# Patient Record
Sex: Female | Born: 1985 | Hispanic: No | Marital: Single | Smoking: Current every day smoker
Health system: Southern US, Community
[De-identification: ages and names within clinical notes are randomized; demographics above are authoritative.]

## PROBLEM LIST (undated history)

## (undated) ENCOUNTER — Inpatient Hospital Stay: Payer: Self-pay

## (undated) DIAGNOSIS — O139 Gestational [pregnancy-induced] hypertension without significant proteinuria, unspecified trimester: Secondary | ICD-10-CM

## (undated) DIAGNOSIS — E282 Polycystic ovarian syndrome: Secondary | ICD-10-CM

## (undated) DIAGNOSIS — F4310 Post-traumatic stress disorder, unspecified: Secondary | ICD-10-CM

## (undated) DIAGNOSIS — F429 Obsessive-compulsive disorder, unspecified: Secondary | ICD-10-CM

## (undated) DIAGNOSIS — F411 Generalized anxiety disorder: Secondary | ICD-10-CM

## (undated) DIAGNOSIS — Z9989 Dependence on other enabling machines and devices: Secondary | ICD-10-CM

## (undated) DIAGNOSIS — G4733 Obstructive sleep apnea (adult) (pediatric): Secondary | ICD-10-CM

## (undated) DIAGNOSIS — J45909 Unspecified asthma, uncomplicated: Secondary | ICD-10-CM

## (undated) HISTORY — PX: TOOTH EXTRACTION: SUR596

## (undated) HISTORY — DX: Polycystic ovarian syndrome: E28.2

## (undated) HISTORY — DX: Obsessive-compulsive disorder, unspecified: F42.9

## (undated) HISTORY — PX: NO PAST SURGERIES: SHX2092

---

## 2004-02-07 DIAGNOSIS — O1490 Unspecified pre-eclampsia, unspecified trimester: Secondary | ICD-10-CM

## 2015-05-15 ENCOUNTER — Ambulatory Visit: Payer: Self-pay | Admitting: Family Medicine

## 2015-05-30 DIAGNOSIS — Z720 Tobacco use: Secondary | ICD-10-CM | POA: Insufficient documentation

## 2015-05-30 DIAGNOSIS — J4520 Mild intermittent asthma, uncomplicated: Secondary | ICD-10-CM | POA: Insufficient documentation

## 2015-05-30 DIAGNOSIS — F411 Generalized anxiety disorder: Secondary | ICD-10-CM | POA: Insufficient documentation

## 2015-05-30 DIAGNOSIS — E669 Obesity, unspecified: Secondary | ICD-10-CM | POA: Insufficient documentation

## 2015-06-03 ENCOUNTER — Encounter: Payer: Self-pay | Admitting: Gynecology

## 2015-06-03 ENCOUNTER — Ambulatory Visit
Admission: EM | Admit: 2015-06-03 | Discharge: 2015-06-03 | Disposition: A | Discharge status: Home / Self Care | Payer: Medicaid Other | Attending: Family Medicine | Admitting: Family Medicine

## 2015-06-03 DIAGNOSIS — F17200 Nicotine dependence, unspecified, uncomplicated: Secondary | ICD-10-CM | POA: Insufficient documentation

## 2015-06-03 DIAGNOSIS — Z79899 Other long term (current) drug therapy: Secondary | ICD-10-CM | POA: Diagnosis not present

## 2015-06-03 DIAGNOSIS — J069 Acute upper respiratory infection, unspecified: Secondary | ICD-10-CM | POA: Diagnosis present

## 2015-06-03 DIAGNOSIS — J0100 Acute maxillary sinusitis, unspecified: Secondary | ICD-10-CM

## 2015-06-03 DIAGNOSIS — R059 Cough, unspecified: Secondary | ICD-10-CM

## 2015-06-03 DIAGNOSIS — R05 Cough: Secondary | ICD-10-CM

## 2015-06-03 HISTORY — DX: Unspecified asthma, uncomplicated: J45.909

## 2015-06-03 HISTORY — DX: Post-traumatic stress disorder, unspecified: F43.10

## 2015-06-03 LAB — RAPID STREP SCREEN (MED CTR MEBANE ONLY): Streptococcus, Group A Screen (Direct): NEGATIVE

## 2015-06-03 LAB — RAPID INFLUENZA A&B ANTIGENS
Influenza A (ARMC): NOT DETECTED
Influenza B (ARMC): NOT DETECTED

## 2015-06-03 MED ORDER — HYDROCOD POLST-CPM POLST ER 10-8 MG/5ML PO SUER: 5.0000 mL | Freq: Every evening | ORAL | Status: DC | PRN

## 2015-06-03 MED ORDER — AMOXICILLIN 875 MG PO TABS: 875.0000 mg | ORAL_TABLET | Freq: Two times a day (BID) | ORAL | Status: DC

## 2015-06-03 NOTE — ED Provider Notes (Signed)
CSN: [id number]     Arrival date & time 06/03/15  0902 History   First MD Initiated Contact with Patient 06/03/15 0912     Chief Complaint  Patient presents with  . Sore Throat   (Consider location/radiation/quality/duration/timing/severity/associated sxs/prior Treatment) Patient is a 29 y.o. female presenting with URI. The history is provided by the patient.  URI Presenting symptoms: congestion, cough, facial pain, fever and sore throat   Severity:  Moderate Onset quality:  Sudden Duration:  10 days Timing:  Constant Progression:  Worsening Chronicity:  New Relieved by:  Nothing Ineffective treatments:  OTC medications Associated symptoms: headaches and sinus pain   Associated symptoms: no myalgias and no wheezing     Past Medical History  Diagnosis Date  . Asthma   . PTSD (post-traumatic stress disorder)    History reviewed. No pertinent past surgical history. No family history on file. Social History  Substance Use Topics  . Smoking status: Current Every Day Smoker  . Smokeless tobacco: None  . Alcohol Use: Yes   OB History    No data available     Review of Systems  Constitutional: Positive for fever.  HENT: Positive for congestion and sore throat.   Respiratory: Positive for cough. Negative for wheezing.   Musculoskeletal: Negative for myalgias.  Neurological: Positive for headaches.    Allergies  Review of patient's allergies indicates no known allergies.  Home Medications   Prior to Admission medications   Medication Sig Start Date End Date Taking? Authorizing Provider  albuterol (PROVENTIL) (2.5 MG/3ML) 0.083% nebulizer solution Take 2.5 mg by nebulization every 6 (six) hours as needed for wheezing or shortness of breath.   Yes Historical Provider, MD  sertraline (ZOLOFT) 100 MG tablet Take 100 mg by mouth daily.   Yes Historical Provider, MD  amoxicillin (AMOXIL) 875 MG tablet Take 1 tablet (875 mg total) by mouth 2 (two) times daily. 06/03/15    Payton Mccallum, MD  chlorpheniramine-HYDROcodone (TUSSIONEX PENNKINETIC ER) 10-8 MG/5ML SUER Take 5 mLs by mouth at bedtime as needed for cough. 06/03/15   Payton Mccallum, MD   Meds Ordered and Administered this Visit  Medications - No data to display  BP 146/94 mmHg  Pulse 64  Temp(Src) 98 F (36.7 C) (Oral)  Resp 16  Ht 5' (1.524 m)  Wt 155 lb (70.308 kg)  BMI 30.27 kg/m2  SpO2 100%  LMP 04/28/2015 No data found.   Physical Exam  Constitutional: She appears well-developed and well-nourished. No distress.  HENT:  Head: Normocephalic and atraumatic.  Right Ear: Tympanic membrane, external ear and ear canal normal.  Left Ear: Tympanic membrane, external ear and ear canal normal.  Nose: Mucosal edema and rhinorrhea present. No nose lacerations, sinus tenderness, nasal deformity, septal deviation or nasal septal hematoma. No epistaxis.  No foreign bodies. Right sinus exhibits maxillary sinus tenderness and frontal sinus tenderness. Left sinus exhibits maxillary sinus tenderness and frontal sinus tenderness.  Mouth/Throat: Uvula is midline, oropharynx is clear and moist and mucous membranes are normal. No oropharyngeal exudate.  Eyes: Conjunctivae and EOM are normal. Pupils are equal, round, and reactive to light. Right eye exhibits no discharge. Left eye exhibits no discharge. No scleral icterus.  Neck: Normal range of motion. Neck supple. No thyromegaly present.  Cardiovascular: Normal rate, regular rhythm and normal heart sounds.   Pulmonary/Chest: Effort normal and breath sounds normal. No respiratory distress. She has no wheezes. She has no rales.  Lymphadenopathy:    She has no cervical adenopathy.  Neurological: She is alert.  Skin: She is not diaphoretic.  Nursing note and vitals reviewed.   ED Course  Procedures (including critical care time)  Labs Review Labs Reviewed  RAPID STREP SCREEN (NOT AT [facility])  RAPID INFLUENZA A&B ANTIGENS ([facility] ONLY)  CULTURE, GROUP A STREP  ([facility] ONLY)    Imaging Review No results found.   Visual Acuity Review  Right Eye Distance:   Left Eye Distance:   Bilateral Distance:    Right Eye Near:   Left Eye Near:    Bilateral Near:         MDM   1. Acute maxillary sinusitis, recurrence not specified   2. Cough    Discharge Medication List as of 06/03/2015  9:52 AM    START taking these medications   Details  amoxicillin (AMOXIL) 875 MG tablet Take 1 tablet (875 mg total) by mouth 2 (two) times daily., Starting 06/03/2015, Until Discontinued, Normal    chlorpheniramine-HYDROcodone (TUSSIONEX PENNKINETIC ER) 10-8 MG/5ML SUER Take 5 mLs by mouth at bedtime as needed for cough., Starting 06/03/2015, Until Discontinued, Normal      1. Lab results and diagnosis reviewed with patient/parent/guardian/family 2. rx as per orders above; reviewed possible side effects, interactions, risks and benefits  3. Recommend supportive treatment with rest, increased fluids, otc analgesics 4. Follow-up prn if symptoms worsen or don't improve    Payton Mccallum, MD 06/03/15 1151

## 2015-06-03 NOTE — ED Notes (Signed)
Patient c/o sore throat/ chest congestion / body ache / chills x week

## 2015-06-05 LAB — CULTURE, GROUP A STREP (THRC)

## 2015-06-05 NOTE — ED Notes (Signed)
Final report of strep testing negative  

## 2015-07-28 ENCOUNTER — Encounter: Payer: Self-pay | Admitting: Emergency Medicine

## 2015-07-28 ENCOUNTER — Ambulatory Visit
Admission: EM | Admit: 2015-07-28 | Discharge: 2015-07-28 | Disposition: A | Discharge status: Home / Self Care | Payer: Medicaid Other | Attending: Family Medicine | Admitting: Family Medicine

## 2015-07-28 DIAGNOSIS — J320 Chronic maxillary sinusitis: Secondary | ICD-10-CM | POA: Diagnosis not present

## 2015-07-28 HISTORY — DX: Obsessive-compulsive disorder, unspecified: F42.9

## 2015-07-28 HISTORY — DX: Obstructive sleep apnea (adult) (pediatric): G47.33

## 2015-07-28 HISTORY — DX: Dependence on other enabling machines and devices: Z99.89

## 2015-07-28 HISTORY — DX: Generalized anxiety disorder: F41.1

## 2015-07-28 MED ORDER — FLUTICASONE PROPIONATE 50 MCG/ACT NA SUSP: 2.0000 | Freq: Every day | NASAL | Status: DC

## 2015-07-28 MED ORDER — CEFUROXIME AXETIL 250 MG PO TABS
ORAL_TABLET | ORAL | Status: DC
Start: 2015-07-28 — End: 2016-04-25

## 2015-07-28 NOTE — Discharge Instructions (Signed)

## 2015-07-28 NOTE — ED Provider Notes (Signed)
CSN: [id number]     Arrival date & time 07/28/15  1021 History   First MD Initiated Contact with Patient 07/28/15 1247     Chief Complaint  Patient presents with  . Sinusitis  . Nasal Congestion   (Consider location/radiation/quality/duration/timing/severity/associated sxs/prior Treatment) HPI   29 year old female who returns to been seen here approximately 4 weeks agowith an acute maxillary sinusitis. Flu testing and  Strep Cultures were all negative is treated with amoxicillin which she states she had completed the course spite the pills being very large. Despite this she presents now with sinus pain and pressure nasal congestion coughing with a productive brown sputum and chills but no measurable fever. She was recently been diagnosed with obstructive sleep apnea but cannot use her CPAP machine due to her nasal stuffiness. States she continues to have but body aching and fatigue. Past Medical History  Diagnosis Date  . Asthma   . PTSD (post-traumatic stress disorder)    History reviewed. No pertinent past surgical history. History reviewed. No pertinent family history. Social History  Substance Use Topics  . Smoking status: Current Every Day Smoker    Types: Cigarettes  . Smokeless tobacco: None  . Alcohol Use: Yes   OB History    No data available     Review of Systems  Constitutional: Positive for chills, activity change and fatigue. Negative for fever, diaphoresis and appetite change.  HENT: Positive for congestion, postnasal drip, rhinorrhea, sinus pressure, sneezing and sore throat.   Respiratory: Positive for cough. Negative for wheezing and stridor.   All other systems reviewed and are negative.   Allergies  Sulfa antibiotics  Home Medications   Prior to Admission medications   Medication Sig Start Date End Date Taking? Authorizing Provider  albuterol (PROVENTIL) (2.5 MG/3ML) 0.083% nebulizer solution Take 2.5 mg by nebulization every 6 (six) hours as needed for  wheezing or shortness of breath.    Historical Provider, MD  amoxicillin (AMOXIL) 875 MG tablet Take 1 tablet (875 mg total) by mouth 2 (two) times daily. 06/03/15   Payton Mccallum, MD  cefUROXime (CEFTIN) 250 MG tablet Take one tablet BID with food 07/28/15   Lutricia Feil, PA-C  chlorpheniramine-HYDROcodone (TUSSIONEX PENNKINETIC ER) 10-8 MG/5ML SUER Take 5 mLs by mouth at bedtime as needed for cough. 06/03/15   Payton Mccallum, MD  fluticasone (FLONASE) 50 MCG/ACT nasal spray Place 2 sprays into both nostrils daily. 07/28/15   Lutricia Feil, PA-C  sertraline (ZOLOFT) 100 MG tablet Take 200 mg by mouth daily.     Historical Provider, MD   Meds Ordered and Administered this Visit  Medications - No data to display  BP 144/91 mmHg  Pulse 62  Temp(Src) 97.6 F (36.4 C) (Tympanic)  Resp 16  Ht 5' (1.524 m)  Wt 153 lb (69.4 kg)  BMI 29.88 kg/m2  SpO2 100%  LMP 06/28/2015 (Approximate) No data found.   Physical Exam  Constitutional: She is oriented to person, place, and time. She appears well-developed and well-nourished. No distress.  HENT:  Head: Normocephalic and atraumatic.  Right Ear: External ear normal.  Left Ear: External ear normal.  Nose: Nose normal.  Mouth/Throat: Oropharynx is clear and moist. No oropharyngeal exudate.  Continues to have tenderness to percussion over the maxillary sinuses  Eyes: Conjunctivae are normal. Pupils are equal, round, and reactive to light. Right eye exhibits no discharge. Left eye exhibits no discharge.  Neck: Normal range of motion. Neck supple.  Pulmonary/Chest: Effort normal and breath  sounds normal. No stridor. No respiratory distress. She has no wheezes. She has no rales.  Musculoskeletal: Normal range of motion. She exhibits no edema or tenderness.  Lymphadenopathy:    She has no cervical adenopathy.  Neurological: She is alert and oriented to person, place, and time.  Skin: Skin is warm and dry. No rash noted. She is not diaphoretic.   Psychiatric: Her behavior is normal. Judgment and thought content normal.  Nursing note and vitals reviewed.   ED Course  Procedures (including critical care time)  Labs Review Labs Reviewed - No data to display  Imaging Review No results found.   Visual Acuity Review  Right Eye Distance:   Left Eye Distance:   Bilateral Distance:    Right Eye Near:   Left Eye Near:    Bilateral Near:         MDM   1. Chronic maxillary sinusitis    New Prescriptions   CEFUROXIME (CEFTIN) 250 MG TABLET    Take one tablet BID with food   FLUTICASONE (FLONASE) 50 MCG/ACT NASAL SPRAY    Place 2 sprays into both nostrils daily.  Plan: 1. Test/x-ray results and diagnosis reviewed with patient 2. rx as per orders; risks, benefits, potential side effects reviewed with patient 3. Recommend supportive treatment with fluids and rest. I advised her to use a cool mist vaporizer or humidifier at nighttime. I will add Flonase to her regimen and I have told her that I believe she hurt her amoxicillin to fully treat her sinusitis symptoms which are now to Ceftin for 10 days. She continues to still not improve then I have recommended she see ear nose and throat for further evaluation and treatment. Hopefully she'll be able to start using her CPAP machine as her sinusitis clears. 4. F/u prn if symptoms worsen or don't improve     Lutricia Feil, PA-C 07/28/15 1318

## 2015-07-28 NOTE — ED Notes (Signed)
Patient c/o sinus pain and pressure, nasal congestion off and on for 4 weeks.  Patient also reports ongoing cough and chest congestion. Patient denies fevers.

## 2015-07-30 NOTE — L&D Delivery Note (Signed)
Delivery Note Primary OB: [facility] Delivery Physician: Annamarie Major, MD Gestational Age: Premature at 52 6/[redacted] weeks gestation Antepartum complications: history of preterm delivery  Intrapartum complications: Preterm Labor  A viable Female was delivered via vertex perentation.  Apgars:9 ,9  Weight:  4 lb 13 oz .   Placenta status: spontaneous and Intact.  Cord: 3+ vessels;  with the following complications: none.  Anesthesia:  none Episiotomy:  none Lacerations:  none Suture Repair: none Est. Blood Loss (mL):  200 mL  Mom to postpartum.  Baby to Couplet care / Skin to Skin. Plans BTL for contraception.  Annamarie Major, MD Dept of OB/GYN [phone]

## 2015-10-12 ENCOUNTER — Ambulatory Visit
Admission: RE | Admit: 2015-10-12 | Discharge: 2015-10-12 | Disposition: A | Discharge status: Home / Self Care | Payer: Medicaid Other | Source: Ambulatory Visit | Attending: Emergency Medicine | Admitting: Emergency Medicine

## 2015-10-12 ENCOUNTER — Ambulatory Visit
Admission: EM | Admit: 2015-10-12 | Discharge: 2015-10-12 | Disposition: A | Discharge status: Hospice | Payer: Medicaid Other | Attending: Family Medicine | Admitting: Family Medicine

## 2015-10-12 ENCOUNTER — Encounter: Payer: Self-pay | Admitting: Emergency Medicine

## 2015-10-12 DIAGNOSIS — Z3200 Encounter for pregnancy test, result unknown: Secondary | ICD-10-CM

## 2015-10-12 DIAGNOSIS — G4733 Obstructive sleep apnea (adult) (pediatric): Secondary | ICD-10-CM | POA: Diagnosis not present

## 2015-10-12 DIAGNOSIS — Z3A01 Less than 8 weeks gestation of pregnancy: Secondary | ICD-10-CM | POA: Insufficient documentation

## 2015-10-12 DIAGNOSIS — F411 Generalized anxiety disorder: Secondary | ICD-10-CM | POA: Insufficient documentation

## 2015-10-12 DIAGNOSIS — F4310 Post-traumatic stress disorder, unspecified: Secondary | ICD-10-CM | POA: Diagnosis not present

## 2015-10-12 DIAGNOSIS — Z3490 Encounter for supervision of normal pregnancy, unspecified, unspecified trimester: Secondary | ICD-10-CM

## 2015-10-12 DIAGNOSIS — Z3201 Encounter for pregnancy test, result positive: Secondary | ICD-10-CM

## 2015-10-12 DIAGNOSIS — R102 Pelvic and perineal pain: Secondary | ICD-10-CM

## 2015-10-12 DIAGNOSIS — F429 Obsessive-compulsive disorder, unspecified: Secondary | ICD-10-CM | POA: Insufficient documentation

## 2015-10-12 DIAGNOSIS — J45909 Unspecified asthma, uncomplicated: Secondary | ICD-10-CM | POA: Diagnosis not present

## 2015-10-12 DIAGNOSIS — O009 Unspecified ectopic pregnancy without intrauterine pregnancy: Secondary | ICD-10-CM | POA: Diagnosis present

## 2015-10-12 DIAGNOSIS — F17210 Nicotine dependence, cigarettes, uncomplicated: Secondary | ICD-10-CM | POA: Insufficient documentation

## 2015-10-12 DIAGNOSIS — R109 Unspecified abdominal pain: Secondary | ICD-10-CM | POA: Diagnosis present

## 2015-10-12 LAB — COMPREHENSIVE METABOLIC PANEL
ALT: 10 U/L — ABNORMAL LOW (ref 14–54)
AST: 18 U/L (ref 15–41)
Albumin: 4.3 g/dL (ref 3.5–5.0)
Alkaline Phosphatase: 35 U/L — ABNORMAL LOW (ref 38–126)
Anion gap: 3 — ABNORMAL LOW (ref 5–15)
BUN: 9 mg/dL (ref 6–20)
CO2: 24 mmol/L (ref 22–32)
Calcium: 8.7 mg/dL — ABNORMAL LOW (ref 8.9–10.3)
Chloride: 106 mmol/L (ref 101–111)
Creatinine, Ser: 0.58 mg/dL (ref 0.44–1.00)
GFR calc Af Amer: >60 mL/min (ref >60)
GFR calc non Af Amer: >60 mL/min (ref >60)
Glucose, Bld: 98 mg/dL (ref 65–99)
Potassium: 3.7 mmol/L (ref 3.5–5.1)
Sodium: 133 mmol/L — ABNORMAL LOW (ref 135–145)
Total Bilirubin: 0.5 mg/dL (ref 0.3–1.2)
Total Protein: 7.7 g/dL (ref 6.5–8.1)

## 2015-10-12 LAB — CBC WITH DIFFERENTIAL/PLATELET
Basophils Absolute: 0.1 10*3/uL (ref 0–0.1)
Basophils Relative: 1 %
Eosinophils Absolute: 0.3 10*3/uL (ref 0–0.7)
Eosinophils Relative: 3 %
HCT: 37.3 % (ref 35.0–47.0)
Hemoglobin: 12.6 g/dL (ref 12.0–16.0)
Lymphocytes Relative: 30 %
Lymphs Abs: 2.4 10*3/uL (ref 1.0–3.6)
MCH: 32.8 pg (ref 26.0–34.0)
MCHC: 33.9 g/dL (ref 32.0–36.0)
MCV: 96.7 fL (ref 80.0–100.0)
Monocytes Absolute: 0.5 10*3/uL (ref 0.2–0.9)
Monocytes Relative: 6 %
Neutro Abs: 4.9 10*3/uL (ref 1.4–6.5)
Neutrophils Relative %: 60 %
Platelets: 332 10*3/uL (ref 150–440)
RBC: 3.85 MIL/uL (ref 3.80–5.20)
RDW: 12.9 % (ref 11.5–14.5)
WBC: 8.1 10*3/uL (ref 3.6–11.0)

## 2015-10-12 LAB — URINALYSIS COMPLETE WITH MICROSCOPIC (ARMC ONLY)
Bilirubin Urine: NEGATIVE
Glucose, UA: NEGATIVE mg/dL
Hgb urine dipstick: NEGATIVE
Ketones, ur: NEGATIVE mg/dL
Leukocytes, UA: NEGATIVE
Nitrite: NEGATIVE
Protein, ur: NEGATIVE mg/dL
Specific Gravity, Urine: 1.02 (ref 1.005–1.030)
pH: 8.5 — ABNORMAL HIGH (ref 5.0–8.0)

## 2015-10-12 LAB — HCG, QUANTITATIVE, PREGNANCY: hCG, Beta Chain, Quant, S: 11399 m[IU]/mL — ABNORMAL HIGH (ref <5)

## 2015-10-12 NOTE — ED Provider Notes (Signed)
CSN: [id number]     Arrival date & time 10/12/15  1132 History   First MD Initiated Contact with Patient 10/12/15 1202     Chief Complaint  Patient presents with  . Abdominal Pain   (Consider location/radiation/quality/duration/timing/severity/associated sxs/prior Treatment) HPI  This a 30 year old female who presents to sudden onset last night of cramping abdominal pain which is in the suprapubic region. Face that on Monday or Tuesday she had a positive home pregnancy test. She has been using birth control pills but has had multiple courses of antibiotics which may have decreased its effectiveness. She denies any vaginal bleeding or  discharge. She states that the pain in the suprapubic area also be perceived in the vagina as well.      Past Medical History  Diagnosis Date  . Asthma   . PTSD (post-traumatic stress disorder)   . OSA on CPAP   . Obsessive compulsive disorder   . GAD (generalized anxiety disorder)    History reviewed. No pertinent past surgical history. History reviewed. No pertinent family history. Social History  Substance Use Topics  . Smoking status: Current Every Day Smoker    Types: Cigarettes  . Smokeless tobacco: None  . Alcohol Use: No   OB History    Gravida Para Term Preterm AB TAB SAB Ectopic Multiple Living   1              Review of Systems  Constitutional: Negative for fever, chills, activity change and fatigue.  Genitourinary: Positive for vaginal pain and pelvic pain. Negative for urgency, decreased urine volume, vaginal bleeding and vaginal discharge.  All other systems reviewed and are negative.   Allergies  Sulfa antibiotics  Home Medications   Prior to Admission medications   Medication Sig Start Date End Date Taking? Authorizing Provider  sertraline (ZOLOFT) 100 MG tablet Take 200 mg by mouth daily.    Yes Historical Provider, MD  albuterol (PROVENTIL) (2.5 MG/3ML) 0.083% nebulizer solution Take 2.5 mg by nebulization every 6  (six) hours as needed for wheezing or shortness of breath.    Historical Provider, MD  amoxicillin (AMOXIL) 875 MG tablet Take 1 tablet (875 mg total) by mouth 2 (two) times daily. 06/03/15   Payton Mccallum, MD  cefUROXime (CEFTIN) 250 MG tablet Take one tablet BID with food 07/28/15   Lutricia Feil, PA-C  chlorpheniramine-HYDROcodone (TUSSIONEX PENNKINETIC ER) 10-8 MG/5ML SUER Take 5 mLs by mouth at bedtime as needed for cough. 06/03/15   Payton Mccallum, MD  fluticasone (FLONASE) 50 MCG/ACT nasal spray Place 2 sprays into both nostrils daily. 07/28/15   Lutricia Feil, PA-C   Meds Ordered and Administered this Visit  Medications - No data to display  BP 111/78 mmHg  Pulse 67  Temp(Src) 97.8 F (36.6 C) (Oral)  Resp 18  Ht 5' (1.524 m)  Wt 161 lb (73.029 kg)  BMI 31.44 kg/m2  SpO2 100%  LMP 09/02/2015 (Approximate) No data found.   Physical Exam  Constitutional: She is oriented to person, place, and time. She appears well-developed and well-nourished.  HENT:  Head: Normocephalic and atraumatic.  Eyes: Conjunctivae are normal. Pupils are equal, round, and reactive to light.  Neck: Normal range of motion. Neck supple.  Pulmonary/Chest: Effort normal and breath sounds normal. No respiratory distress. She has no wheezes. She has no rales.  Abdominal: Soft. Bowel sounds are normal. She exhibits no distension and no mass. There is tenderness. There is no rebound and no guarding.  Examination shows  the patient to have  mild suprapubic pain in the midline  Musculoskeletal: Normal range of motion.  Neurological: She is alert and oriented to person, place, and time.  Skin: Skin is warm and dry.  Psychiatric: She has a normal mood and affect. Her behavior is normal. Judgment and thought content normal.  Nursing note and vitals reviewed.   ED Course  Procedures (including critical care time)  Labs Review Labs Reviewed  HCG, QUANTITATIVE, PREGNANCY - Abnormal; Notable for the  following:    hCG, Beta Chain, Quant, S 11399 (*)    All other components within normal limits  URINALYSIS COMPLETEWITH MICROSCOPIC ([facility] ONLY) - Abnormal; Notable for the following:    pH 8.5 (*)    Bacteria, UA RARE (*)    Squamous Epithelial / LPF 0-5 (*)    All other components within normal limits  COMPREHENSIVE METABOLIC PANEL - Abnormal; Notable for the following:    Sodium 133 (*)    Calcium 8.7 (*)    ALT 10 (*)    Alkaline Phosphatase 35 (*)    Anion gap 3 (*)    All other components within normal limits  CBC WITH DIFFERENTIAL/PLATELET    Imaging Review No results found.   Visual Acuity Review  Right Eye Distance:   Left Eye Distance:   Bilateral Distance:    Right Eye Near:   Left Eye Near:    Bilateral Near:         MDM   1. Pregnancy   2. Ectopic fetus    Plan: 1. Test/x-ray results and diagnosis reviewed with patient 2. rx as per orders; risks, benefits, potential side effects reviewed with patient 3. Recommend supportive treatment with  OB next week. He has per pain and her being but [redacted] weeks pregnant to rule out a possibility of the neck top will send her for an ultrasound today. If this is negative then she will follow-up with an OB next week. We've given her the brochure for [facility]. 4. F/u prn if symptoms worsen or don't improve     Lutricia Feil, PA-C 10/12/15 [address] Phillis Knack, [place] 10/12/15 1351

## 2015-10-12 NOTE — ED Notes (Signed)
Pt reports cramping abdominal pain started last night. Reports just had positive home pregnancy test on Tuesday. Denies vaginal bleeding.

## 2015-10-12 NOTE — Discharge Instructions (Signed)
Eating Plan for Pregnant Women °While you are pregnant, your body will require additional nutrition to help support your growing baby. It is recommended that you consume: °· 150 additional calories each day during your first trimester. °· 300 additional calories each day during your second trimester. °· 300 additional calories each day during your third trimester. °Eating a healthy, well-balanced diet is very important for your health and for your baby's health. You also have a higher need for some vitamins and minerals, such as folic acid, calcium, iron, and vitamin D. °WHAT DO I NEED TO KNOW ABOUT EATING DURING PREGNANCY? °· Do not try to lose weight or go on a diet during pregnancy. °· Choose healthy, nutritious foods. Choose ½ of a sandwich with a glass of milk instead of a candy bar or a high-calorie sugar-sweetened beverage. °· Limit your overall intake of foods that have "empty calories." These are foods that have little nutritional value, such as sweets, desserts, candies, sugar-sweetened beverages, and fried foods. °· Eat a variety of foods, especially fruits and vegetables. °· Take a prenatal vitamin to help meet the additional needs during pregnancy, specifically for folic acid, iron, calcium, and vitamin D. °· Remember to stay active. Ask your health care provider for exercise recommendations that are specific to you. °· Practice good food safety and cleanliness, such as washing your hands before you eat and after you prepare raw meat. This helps to prevent foodborne illnesses, such as listeriosis, that can be very dangerous for your baby. Ask your health care provider for more information about listeriosis. °WHAT DOES 150 EXTRA CALORIES LOOK LIKE? °Healthy options for an additional 150 calories each day could be any of the following: °· Plain low-fat yogurt (6-8 oz) with ½ cup of berries. °· 1 apple with 2 teaspoons of peanut butter. °· Cut-up vegetables with ¼ cup of hummus. °· Low-fat chocolate milk  (8 oz or 1 cup). °· 1 string cheese with 1 medium orange. °· ½ of a peanut butter and jelly sandwich on whole-wheat bread (1 tsp of peanut butter). °For 300 calories, you could eat two of those healthy options each day.  °WHAT IS A HEALTHY AMOUNT OF WEIGHT TO GAIN? °The recommended amount of weight for you to gain is based on your pre-pregnancy BMI. If your pre-pregnancy BMI was: °· Less than 18 (underweight), you should gain 28-40 lb. °· 18-24.9 (normal), you should gain 25-35 lb. °· 25-29.9 (overweight), you should gain 15-25 lb. °· Greater than 30 (obese), you should gain 11-20 lb. °WHAT IF I AM HAVING TWINS OR MULTIPLES? °Generally, pregnant women who will be having twins or multiples may need to increase their daily calories by 300-600 calories each day. The recommended range for total weight gain is 25-54 lb, depending on your pre-pregnancy BMI. Talk with your health care provider for specific guidance about additional nutritional needs, weight gain, and exercise during your pregnancy. °WHAT FOODS CAN I EAT? °Grains °Any grains. Try to choose whole grains, such as whole-wheat bread, oatmeal, or brown rice. °Vegetables °Any vegetables. Try to eat a variety of colors and types of vegetables to get a full range of vitamins and minerals. Remember to wash your vegetables well before eating. °Fruits °Any fruits. Try to eat a variety of colors and types of fruit to get a full range of vitamins and minerals. Remember to wash your fruits well before eating. °Meats and Other Protein Sources °Lean meats, including chicken, turkey, fish, and lean cuts of beef, veal, or pork.   Make sure that all meats are cooked to "well done." Tofu. Tempeh. Beans. Eggs. Peanut butter and other nut butters. Seafood, such as shrimp, crab, and lobster. If you choose fish, select types that are higher in omega-3 fatty acids, including salmon, herring, mussels, trout, sardines, and pollock. Make sure that all meats are cooked to food-safe  temperatures. Dairy Pasteurized milk and milk alternatives. Pasteurized yogurt and pasteurized cheese. Cottage cheese. Sour cream. Beverages Water. Juices that contain 100% fruit juice or vegetable juice. Caffeine-free teas and decaffeinated coffee. Drinks that contain caffeine are okay to drink, but it is better to avoid caffeine. Keep your total caffeine intake to less than 200 mg each day (12 oz of coffee, tea, or soda) or as directed by your health care provider. Condiments Any pasteurized condiments. Sweets and Desserts Any sweets and desserts. Fats and Oils Any fats and oils. The items listed above may not be a complete list of recommended foods or beverages. Contact your dietitian for more options. WHAT FOODS ARE NOT RECOMMENDED? Vegetables Unpasteurized (raw) vegetable juices. Fruits Unpasteurized (raw) fruit juices. Meats and Other Protein Sources Cured meats that have nitrates, such as bacon, salami, and hotdogs. Luncheon meats, bologna, or other deli meats (unless they are reheated until they are steaming hot). Refrigerated pate, meat spreads from a meat counter, smoked seafood that is found in the refrigerated section of a store. Raw fish, such as sushi or sashimi. High mercury content fish, such as tilefish, shark, swordfish, and king mackerel. Raw meats, such as tuna or beef tartare. Undercooked meats and poultry. Make sure that all meats are cooked to food-safe temperatures. Dairy Unpasteurized (raw) milk and any foods that have raw milk in them. Soft cheeses, such as feta, queso blanco, queso fresco, Brie, Camembert cheeses, blue-veined cheeses, and Panela cheese (unless it is made with pasteurized milk, which must be stated on the label). Beverages Alcohol. Sugar-sweetened beverages, such as sodas, teas, or energy drinks. Condiments Homemade fermented foods and drinks, such as pickles, sauerkraut, or kombucha drinks. (Store-bought pasteurized versions of these are  okay.) Other Salads that are made in the store, such as ham salad, chicken salad, egg salad, tuna salad, and seafood salad. The items listed above may not be a complete list of foods and beverages to avoid. Contact your dietitian for more information.   This information is not intended to replace advice given to you by your health care provider. Make sure you discuss any questions you have with your health care provider.   Document Released: 04/29/2014 Document Reviewed: 04/29/2014 Elsevier Interactive Patient Education [employer]  First Trimester of Pregnancy The first trimester of pregnancy is from week 1 until the end of week 12 (months 1 through 3). A week after a sperm fertilizes an egg, the egg will implant on the wall of the uterus. This embryo will begin to develop into a baby. Genes from you and your partner are forming the baby. The female genes determine whether the baby is a boy or a girl. At 6-8 weeks, the eyes and face are formed, and the heartbeat can be seen on ultrasound. At the end of 12 weeks, all the baby's organs are formed.  Now that you are pregnant, you will want to do everything you can to have a healthy baby. Two of the most important things are to get good prenatal care and to follow your health care provider's instructions. Prenatal care is all the medical care you receive before the baby's birth. This  care will help prevent, find, and treat any problems during the pregnancy and childbirth. BODY CHANGES Your body goes through many changes during pregnancy. The changes vary from woman to woman.   You may gain or lose a couple of pounds at first.  You may feel sick to your stomach (nauseous) and throw up (vomit). If the vomiting is uncontrollable, call your health care provider.  You may tire easily.  You may develop headaches that can be relieved by medicines approved by your health care provider.  You may urinate more often. Painful urination may mean you  have a bladder infection.  You may develop heartburn as a result of your pregnancy.  You may develop constipation because certain hormones are causing the muscles that push waste through your intestines to slow down.  You may develop hemorrhoids or swollen, bulging veins (varicose veins).  Your breasts may begin to grow larger and become tender. Your nipples may stick out more, and the tissue that surrounds them (areola) may become darker.  Your gums may bleed and may be sensitive to brushing and flossing.  Dark spots or blotches (chloasma, mask of pregnancy) may develop on your face. This will likely fade after the baby is born.  Your menstrual periods will stop.  You may have a loss of appetite.  You may develop cravings for certain kinds of food.  You may have changes in your emotions from day to day, such as being excited to be pregnant or being concerned that something may go wrong with the pregnancy and baby.  You may have more vivid and strange dreams.  You may have changes in your hair. These can include thickening of your hair, rapid growth, and changes in texture. Some women also have hair loss during or after pregnancy, or hair that feels dry or thin. Your hair will most likely return to normal after your baby is born. WHAT TO EXPECT AT YOUR PRENATAL VISITS During a routine prenatal visit:  You will be weighed to make sure you and the baby are growing normally.  Your blood pressure will be taken.  Your abdomen will be measured to track your baby's growth.  The fetal heartbeat will be listened to starting around week 10 or 12 of your pregnancy.  Test results from any previous visits will be discussed. Your health care provider may ask you:  How you are feeling.  If you are feeling the baby move.  If you have had any abnormal symptoms, such as leaking fluid, bleeding, severe headaches, or abdominal cramping.  If you are using any tobacco products, including  cigarettes, chewing tobacco, and electronic cigarettes.  If you have any questions. Other tests that may be performed during your first trimester include:  Blood tests to find your blood type and to check for the presence of any previous infections. They will also be used to check for low iron levels (anemia) and Rh antibodies. Later in the pregnancy, blood tests for diabetes will be done along with other tests if problems develop.  Urine tests to check for infections, diabetes, or protein in the urine.  An ultrasound to confirm the proper growth and development of the baby.  An amniocentesis to check for possible genetic problems.  Fetal screens for spina bifida and Down syndrome.  You may need other tests to make sure you and the baby are doing well.  HIV (human immunodeficiency virus) testing. Routine prenatal testing includes screening for HIV, unless you choose not to have  this test. HOME CARE INSTRUCTIONS  Medicines  Follow your health care provider's instructions regarding medicine use. Specific medicines may be either safe or unsafe to take during pregnancy.  Take your prenatal vitamins as directed.  If you develop constipation, try taking a stool softener if your health care provider approves. Diet  Eat regular, well-balanced meals. Choose a variety of foods, such as meat or vegetable-based protein, fish, milk and low-fat dairy products, vegetables, fruits, and whole grain breads and cereals. Your health care provider will help you determine the amount of weight gain that is right for you.  Avoid raw meat and uncooked cheese. These carry germs that can cause birth defects in the baby.  Eating four or five small meals rather than three large meals a day may help relieve nausea and vomiting. If you start to feel nauseous, eating a few soda crackers can be helpful. Drinking liquids between meals instead of during meals also seems to help nausea and vomiting.  If you develop  constipation, eat more high-fiber foods, such as fresh vegetables or fruit and whole grains. Drink enough fluids to keep your urine clear or pale yellow. Activity and Exercise  Exercise only as directed by your health care provider. Exercising will help you:  Control your weight.  Stay in shape.  Be prepared for labor and delivery.  Experiencing pain or cramping in the lower abdomen or low back is a good sign that you should stop exercising. Check with your health care provider before continuing normal exercises.  Try to avoid standing for long periods of time. Move your legs often if you must stand in one place for a long time.  Avoid heavy lifting.  Wear low-heeled shoes, and practice good posture.  You may continue to have sex unless your health care provider directs you otherwise. Relief of Pain or Discomfort  Wear a good support bra for breast tenderness.   Take warm sitz baths to soothe any pain or discomfort caused by hemorrhoids. Use hemorrhoid cream if your health care provider approves.   Rest with your legs elevated if you have leg cramps or low back pain.  If you develop varicose veins in your legs, wear support hose. Elevate your feet for 15 minutes, 3-4 times a day. Limit salt in your diet. Prenatal Care  Schedule your prenatal visits by the twelfth week of pregnancy. They are usually scheduled monthly at first, then more often in the last 2 months before delivery.  Write down your questions. Take them to your prenatal visits.  Keep all your prenatal visits as directed by your health care provider. Safety  Wear your seat belt at all times when driving.  Make a list of emergency phone numbers, including numbers for family, friends, the hospital, and police and fire departments. General Tips  Ask your health care provider for a referral to a local prenatal education class. Begin classes no later than at the beginning of month 6 of your pregnancy.  Ask for  help if you have counseling or nutritional needs during pregnancy. Your health care provider can offer advice or refer you to specialists for help with various needs.  Do not use hot tubs, steam rooms, or saunas.  Do not douche or use tampons or scented sanitary pads.  Do not cross your legs for long periods of time.  Avoid cat litter boxes and soil used by cats. These carry germs that can cause birth defects in the baby and possibly loss of the fetus  by miscarriage or stillbirth.  Avoid all smoking, herbs, alcohol, and medicines not prescribed by your health care provider. Chemicals in these affect the formation and growth of the baby.  Do not use any tobacco products, including cigarettes, chewing tobacco, and electronic cigarettes. If you need help quitting, ask your health care provider. You may receive counseling support and other resources to help you quit.  Schedule a dentist appointment. At home, brush your teeth with a soft toothbrush and be gentle when you floss. SEEK MEDICAL CARE IF:   You have dizziness.  You have mild pelvic cramps, pelvic pressure, or nagging pain in the abdominal area.  You have persistent nausea, vomiting, or diarrhea.  You have a bad smelling vaginal discharge.  You have pain with urination.  You notice increased swelling in your face, hands, legs, or ankles. SEEK IMMEDIATE MEDICAL CARE IF:   You have a fever.  You are leaking fluid from your vagina.  You have spotting or bleeding from your vagina.  You have severe abdominal cramping or pain.  You have rapid weight gain or loss.  You vomit blood or material that looks like coffee grounds.  You are exposed to [place] measles and have never had them.  You are exposed to fifth disease or chickenpox.  You develop a severe headache.  You have shortness of breath.  You have any kind of trauma, such as from a fall or a car accident.   This information is not intended to replace advice  given to you by your health care provider. Make sure you discuss any questions you have with your health care provider.   Document Released: 07/09/2001 Document Revised: 08/05/2014 Document Reviewed: 05/25/2013 Elsevier Interactive Patient Education [employer]

## 2015-10-12 NOTE — ED Notes (Addendum)
Pre-approval for Ultrasound obtained from Medicaid. Case # [id number]. Approval # [id number]. CPT codes R10.9, Z32.00 and Z33.10.

## 2016-03-03 ENCOUNTER — Emergency Department
Admission: EM | Admit: 2016-03-03 | Discharge: 2016-03-03 | Disposition: A | Discharge status: Home / Self Care | Payer: Medicaid Other | Attending: Emergency Medicine | Admitting: Emergency Medicine

## 2016-03-03 ENCOUNTER — Emergency Department: Payer: Medicaid Other

## 2016-03-03 ENCOUNTER — Encounter: Payer: Self-pay | Admitting: Emergency Medicine

## 2016-03-03 DIAGNOSIS — F17210 Nicotine dependence, cigarettes, uncomplicated: Secondary | ICD-10-CM | POA: Diagnosis not present

## 2016-03-03 DIAGNOSIS — M545 Low back pain, unspecified: Secondary | ICD-10-CM

## 2016-03-03 DIAGNOSIS — Z7951 Long term (current) use of inhaled steroids: Secondary | ICD-10-CM | POA: Diagnosis not present

## 2016-03-03 DIAGNOSIS — O26892 Other specified pregnancy related conditions, second trimester: Secondary | ICD-10-CM | POA: Diagnosis not present

## 2016-03-03 DIAGNOSIS — J45909 Unspecified asthma, uncomplicated: Secondary | ICD-10-CM | POA: Insufficient documentation

## 2016-03-03 DIAGNOSIS — O99332 Smoking (tobacco) complicating pregnancy, second trimester: Secondary | ICD-10-CM | POA: Diagnosis not present

## 2016-03-03 DIAGNOSIS — Z3A26 26 weeks gestation of pregnancy: Secondary | ICD-10-CM | POA: Insufficient documentation

## 2016-03-03 DIAGNOSIS — M5441 Lumbago with sciatica, right side: Secondary | ICD-10-CM | POA: Diagnosis not present

## 2016-03-03 DIAGNOSIS — M5431 Sciatica, right side: Secondary | ICD-10-CM

## 2016-03-03 NOTE — Discharge Instructions (Signed)
You're being treated for symptoms consistent with sciatica, pinched nerve. Continue Tylenol as needed for pain. You may in addition take over-the-counter ibuprofen 400 mg 3 times a day for 2 more days.    You need to follow up with your OB/GYN doctor for likely referral for physical therapy.

## 2016-03-03 NOTE — ED Notes (Signed)
NAD noted at time of D/C. Pt denies questions or concerns. Pt ambulatory to the lobby at this time.  

## 2016-03-03 NOTE — ED Triage Notes (Signed)
Pt c/o lower back pain x 2-3 weeks. Pt states she is [redacted] weeks pregnant. Denies any known injury. Pt states pain unrelieved with Tylenol. Also c/o hip and buttocks. Pt states pain radiates down her back.

## 2016-03-03 NOTE — ED Provider Notes (Signed)
[facility] Emergency Department Provider Note ____________________________________________  Time seen:  I have reviewed the triage vital signs and the triage nursing note.  HISTORY  Chief Complaint Back Pain   Historian Patient  HPI Lauren Tapia is a 30 y.o. female who is approximately [redacted] weeks pregnant, is presenting for low back pain on the right for over a week now which is worsening and goes into the buttock and down the posterior right leg. No trauma. No known overuse. No history of previous problems with low back pain. No history of cancers. No history of osteopenia although she states that she does not drink milk. No abdominal pain. No abnormal vaginal discharge. No fluid leakage or vaginal bleeding. She's been feeling the baby move.  She had been trying Tylenol at home and spoke with her OB/GYN doctor today who recommended ibuprofen and she tried that and didn't seem to help so she came in today for evaluation.  Pain is moderate to severe, no weakness or numbness. No incontinence.  Movement makes the pain worse.    Past Medical History:  Diagnosis Date  . Asthma   . GAD (generalized anxiety disorder)   . Obsessive compulsive disorder   . OSA on CPAP   . PTSD (post-traumatic stress disorder)     Patient Active Problem List   Diagnosis Date Noted  . Ectopic fetus 10/12/2015    History reviewed. No pertinent surgical history.  Prior to Admission medications   Medication Sig Start Date End Date Taking? Authorizing Provider  albuterol (PROVENTIL) (2.5 MG/3ML) 0.083% nebulizer solution Take 2.5 mg by nebulization every 6 (six) hours as needed for wheezing or shortness of breath.    Historical Provider, MD  amoxicillin (AMOXIL) 875 MG tablet Take 1 tablet (875 mg total) by mouth 2 (two) times daily. 06/03/15   Payton Mccallum, MD  cefUROXime (CEFTIN) 250 MG tablet Take one tablet BID with food 07/28/15   Lutricia Feil, PA-C   chlorpheniramine-HYDROcodone (TUSSIONEX PENNKINETIC ER) 10-8 MG/5ML SUER Take 5 mLs by mouth at bedtime as needed for cough. 06/03/15   Payton Mccallum, MD  fluticasone (FLONASE) 50 MCG/ACT nasal spray Place 2 sprays into both nostrils daily. 07/28/15   Lutricia Feil, PA-C  sertraline (ZOLOFT) 100 MG tablet Take 200 mg by mouth daily.     Historical Provider, MD    Allergies  Allergen Reactions  . Sulfa Antibiotics Other (See Comments)    No family history on file.  Social History Social History  Substance Use Topics  . Smoking status: Current Every Day Smoker    Types: Cigarettes  . Smokeless tobacco: Not on file  . Alcohol use No    Review of Systems  Constitutional: Negative for fever. Eyes: Negative for visual changes. ENT: Negative for sore throat. Cardiovascular: Negative for chest pain. Respiratory: Negative for shortness of breath. Gastrointestinal: Negative for abdominal pain, vomiting and diarrhea. Genitourinary: Negative for dysuria. Musculoskeletal: Positive for back pain as per history of present illness. Skin: Negative for rash. Neurological: Negative for headache. 10 point Review of Systems otherwise negative ____________________________________________   PHYSICAL EXAM:  VITAL SIGNS: ED Triage Vitals  Enc Vitals Group     BP 03/03/16 2011 108/76     Pulse Rate 03/03/16 2011 84     Resp --      Temp 03/03/16 2011 98.1 F (36.7 C)     Temp Source 03/03/16 2011 Oral     SpO2 03/03/16 2102 98 %  Weight 03/03/16 2011 155 lb (70.3 kg)     Height 03/03/16 2011 5' (1.524 m)     Head Circumference --      Peak Flow --      Pain Score 03/03/16 2019 10     Pain Loc --      Pain Edu? --      Excl. in GC? --      Constitutional: Alert and oriented. Well appearing and in no distress. HEENT   Head: Normocephalic and atraumatic.      Eyes: Conjunctivae are normal. PERRL. Normal extraocular movements.      Ears:         Nose: No  congestion/rhinnorhea.   Mouth/Throat: Mucous membranes are moist.   Neck: No stridor. Cardiovascular/Chest: Normal rate, regular rhythm.  No murmurs, rubs, or gallops. Respiratory: Normal respiratory effort without tachypnea nor retractions. Breath sounds are clear and equal bilaterally. No wheezes/rales/rhonchi. Gastrointestinal: Soft. No distention, no guarding, no rebound. Nontender.  Gravid above the umbilicus.  Genitourinary/rectal:Deferred Musculoskeletal: Mild tenderness to palpation in the lumbar spine especially on the right side into the right buttock. Normal range of motion in all extremities. No joint effusions.  No lower extremity tenderness.  No edema. Neurologic:  Normal speech and language. No gross or focal neurologic deficits are appreciated. Skin:  Skin is warm, dry and intact. No rash noted. Psychiatric: Mood and affect are normal. Speech and behavior are normal. Patient exhibits appropriate insight and judgment.  ____________________________________________   EKG I, Governor Rooks, MD, the attending physician have personally viewed and interpreted all ECGs.  None ____________________________________________  LABS (pertinent positives/negatives)  Labs Reviewed - No data to display  ____________________________________________  RADIOLOGY All Xrays were viewed by me. Imaging interpreted by Radiologist.  Lumbar spine 2 view: IMPRESSION: No evidence of fracture or subluxation along the lumbar spine. __________________________________________  PROCEDURES  Procedure(s) performed: None  Critical Care performed: None  ____________________________________________   ED COURSE / ASSESSMENT AND PLAN  Pertinent labs & imaging results that were available during my care of the patient were reviewed by me and considered in my medical decision making (see chart for details).   This patient's symptoms are consistent clinically with right-sided sciatica. She  does not have any step-offs palpating her spine, and does not give a history of trauma or known osteopenia or history of cancers, however when I discussed with the patient that I would not necessarily initially jumped to imaging in a patient of this age with symptomatic sciatica, she questioned whether or not it possible to have a fracture without history of trauma. We discussed the rare chance of pathologic fracture due to cancer osteopenia, and she wanted to proceed with x-ray to rule this out. I spoke with the OB/GYN on-call Dr. Jean Rosenthal who indicated to me that he did speak with her earlier and she was stating that she was having pain into her hip and he did recommend ibuprofen 400 mg 3 times a day for just a few days, and indicated that he did not necessarily indicate to her that she needed to come to the emergency department, and was recommending that she would likely need physical therapy. He does not recommend narcotic pain management. I discussed with him imaging, and consistent with my impression, he would not have necessarily imaged, but given patient's high concern and agreement with risk versus benefit, we chose to proceed with x-ray. I also spoke with the radiologist who recommended just 2 views to limit radiation exposure to  the fetus.  The x-ray is negative for fracture or abnormality. I discussed with patient her symptoms are consistent with sciatica likely from pregnancy. I am going to recommend that she continue the anti-inflammatory ibuprofen in addition to the Tylenol for the next day or so and follow-up in the office with OB/GYN. They likely will refer her for physical therapy. At this point in time I am not going to start her on any narcotic medication. At this point time I'm not going to start her on prednisone, although I did discuss with her this might be something that the OB/GYN doctors might consider, if several days on the anti-inflammatory does not provide improvement.  Again, no  abdominal symptoms or discomfort, or pelvic discomfort.  CONSULTATIONS:   None   Patient / Family / Caregiver informed of clinical course, medical decision-making process, and agree with plan.   I discussed return precautions, follow-up instructions, and discharged instructions with patient and/or family.   ___________________________________________   FINAL CLINICAL IMPRESSION(S) / ED DIAGNOSES   Final diagnoses:  Low back pain  Sciatica of right side              Note: This dictation was prepared with Dragon dictation. Any transcriptional errors that result from this process are unintentional    Governor Rooks, MD 03/03/16 2245

## 2016-03-03 NOTE — ED Notes (Signed)
Pt ambulatory with slow steady gait; says she thinks she has sciatica; pt is pregnant and only taking tylenol at home with no relief

## 2016-03-25 ENCOUNTER — Ambulatory Visit: Payer: Medicaid Other | Attending: Obstetrics & Gynecology | Admitting: Physical Therapy

## 2016-03-25 DIAGNOSIS — M791 Myalgia, unspecified site: Secondary | ICD-10-CM

## 2016-03-25 DIAGNOSIS — M6281 Muscle weakness (generalized): Secondary | ICD-10-CM | POA: Diagnosis present

## 2016-03-25 NOTE — Patient Instructions (Addendum)
Stretngthening :  Clam Shell 45 Degrees   Lying with hips and knees bent 45, one pillow between knees and ankles. Lift knee with exhale. Be sure pelvis does not roll backward. Do not arch back. Do 10 times, each leg, 2-3 times per day.  http://ss.exer.us/75   Copyright  VHI. All rights reserved.   SEATED VERSION: with proper sitting posture. 10 reps with hands against thighs, exhale and push thighs outside hands   ______________________________  Pillow squeeze between knees 10 reps   SEATED VERSION: with proper sitting posture. Pillow folded  ______________________________   Side stepping to the right, mini squat  5 reps,   To left 5 reps  Build up to 10 reps.   ________________________________  Stretches  Figure four stretch  5 breaths  childs pose rocking (pillow under belly)    _______________________________  Daily activities :  Standing with knees slight bent, feet hip width  (avoid leaning on one leg)   Lifting:  Mini squat with feet wide, knees behind toes (make sure you can still see your toes)  Inhale  Exhale as you stand  Avoid holding your breath when Getting out of the chair:  Scoot to front half of the chair Heels under feet nose over toes  Inhale like you are smelling roses Exhale to stand   Sitting with feet under knees   Getting into shower/ car: Butt first, lift leg on exhale, small movements   Get shower bench and a toilet chair  _________________    More resources: [employer] by [place]

## 2016-03-26 NOTE — Therapy (Signed)
[place] MAIN [facility] SERVICES [address] [place], [place], [address] Phone: [phone]   Fax:  [phone]  Physical Therapy Evaluation  Patient Details  Name: Lauren Tapia MRN: [id number] Date of Birth: 1986/03/23 Referring Provider: Dr. Kenton Kingfisher   Encounter Date: 03/25/2016      PT End of Session - 03/26/16 1550    Visit Number 1   Number of Visits 1   Date for PT Re-Evaluation 03/26/16   Authorization Type Medicaid coverage limited to 1 visit   PT Start Time 1100   PT Stop Time 1210   PT Time Calculation (min) 70 min   Activity Tolerance Patient tolerated treatment well;No increased pain   Behavior During Therapy WFL for tasks assessed/performed      Past Medical History:  Diagnosis Date  . Asthma   . GAD (generalized anxiety disorder)   . Obsessive compulsive disorder   . OSA on CPAP   . PTSD (post-traumatic stress disorder)     No past surgical history on file.  There were no vitals filed for this visit.       Subjective Assessment - 03/26/16 1550    Subjective Pt is [redacted] weeks pregnant with her 3rd child. Pt has R sided sciatic pain that started 2 months ago that goes down her R posterior thigh. This pain is triggered with stepping into/ out shower, stairclimbing, getting in/out car, driving long periods, rolling in bed.  At worst 10/10, best 6/10. Resting relieves the pain. Pt is not working. Stays with two other children (41 yo, 63 yo). Pt was Dx with DDD a couple of years back but she was not limited in her activities.      Patient is accompained by: Family member   Pertinent History Hx of vaginal deliveries w/o trauma nor LBP. c/o SUI, constipation.              [facility] PT Assessment - 03/25/16 1126      Assessment   Medical Diagnosis R sciatic pain    Referring Provider Dr. Kenton Kingfisher      Precautions   Precautions None     Restrictions   Weight Bearing Restrictions No     Balance Screen   Has the patient  fallen in the past 6 months No                           PT Education - 03/26/16 1549    Education provided Yes   Education Details POC, anatomy, physiology, goals   Person(s) Educated Patient   Methods Explanation;Demonstration;Tactile cues;Verbal cues;Handout   Comprehension Verbalized understanding;Returned demonstration          PT Short Term Goals - 03/26/16 1603      PT SHORT TERM GOAL #1   Title Pt demo IND with HEP   Time 1   Period Days   Status Achieved     PT SHORT TERM GOAL #2   Title Pt will report centralization of her R posterior thigh pain in order to perform her ADLs   Time 1   Period Days   Status Achieved     PT SHORT TERM GOAL #3   Title Pt will demo proper body mechanics with bed mobility, sit to stand, standing, and stepping into/ out shower and car in order to improve QOL and decrease pain   Time 1   Period Days   Status Achieved  Plan - 03/26/16 1556    Clinical Impression Statement Pt is a 30 yo female who is [redacted] weeks pregnant with her 3rd child and experiencing R sided LBP that radiates to her mid posterior thigh. This deficit impacts her ability to step in/out of shower and car, stair navigation, and bed mobility. Pt's clincal presentation included R obturator internus mm tensions, pelvic girdle weakness, poor posture, and poor body mechanics. Pt's pain centralized at the end of the visit and pt demo'd IND with her HEP and was provided education on stretches and proper body mechanics. Pt has met her STG and will be d/c at this time due to limited coverage by Medicaid.     Rehab Potential Good   PT Frequency One time visit   Consulted and Agree with Plan of Care Patient;Family member/caregiver      Patient will benefit from skilled therapeutic intervention in order to improve the following deficits and impairments:  Abnormal gait, Difficulty walking, Decreased activity tolerance, Hypermobility, Pain,  Increased muscle spasms, Decreased safety awareness, Decreased endurance, Hypomobility, Postural dysfunction, Improper body mechanics  Visit Diagnosis: Muscle weakness (generalized)  Myalgia     Problem List Patient Active Problem List   Diagnosis Date Noted  . Ectopic fetus 10/12/2015    Lauren Tapia ,PT, DPT, E-RYT  03/26/2016, 4:05 PM  [place] MAIN [facility] SERVICES [address] [place], [place], [address] Phone: [phone]   Fax:  [phone]  Name: Kaliah Haddaway MRN: [id number] Date of Birth: [birth year]

## 2016-04-03 ENCOUNTER — Ambulatory Visit
Admission: EM | Admit: 2016-04-03 | Discharge: 2016-04-03 | Disposition: A | Discharge status: Home / Self Care | Payer: Medicaid Other | Attending: Family Medicine | Admitting: Family Medicine

## 2016-04-03 DIAGNOSIS — L0291 Cutaneous abscess, unspecified: Secondary | ICD-10-CM | POA: Diagnosis not present

## 2016-04-03 DIAGNOSIS — L02416 Cutaneous abscess of left lower limb: Secondary | ICD-10-CM | POA: Insufficient documentation

## 2016-04-03 DIAGNOSIS — O99333 Smoking (tobacco) complicating pregnancy, third trimester: Secondary | ICD-10-CM | POA: Insufficient documentation

## 2016-04-03 DIAGNOSIS — O26893 Other specified pregnancy related conditions, third trimester: Secondary | ICD-10-CM | POA: Diagnosis present

## 2016-04-03 MED ORDER — AMOXICILLIN 500 MG PO CAPS: 500.0000 mg | ORAL_CAPSULE | Freq: Three times a day (TID) | ORAL | 0 refills | Status: DC

## 2016-04-03 NOTE — ED Triage Notes (Signed)
Patient c/o a cyst between her leg and vaginal area. She says the are is very painful, and its like a hard knot that she noticed about a week ago.

## 2016-04-03 NOTE — ED Provider Notes (Signed)
[facility]    CSN: [id number] Arrival date & time: 04/03/16  1502  First Provider Contact:  None       History   Chief Complaint Chief Complaint  Patient presents with  . Cyst    HPI Lauren Tapia is a 30 y.o. female.   HPI: Patient presents today with cyst/abscess along her left inner thigh. Patient is 7 months pregnant. She denies any problems with her pregnancy thus far. She has noticed this tender area on her left inner thigh for the past week. It has not been draining. It is difficult for her to look at the area given her pregnancy. She denies any history of MRSA in the past. She denies any fever, urinary symptoms, vaginal discharge. She plans to follow up with her OB/GYN this week.  Past Medical History:  Diagnosis Date  . Asthma   . GAD (generalized anxiety disorder)   . Obsessive compulsive disorder   . OSA on CPAP   . PTSD (post-traumatic stress disorder)     Patient Active Problem List   Diagnosis Date Noted  . Ectopic fetus 10/12/2015    History reviewed. No pertinent surgical history.  OB History    Gravida Para Term Preterm AB Living   1             SAB TAB Ectopic Multiple Live Births                   Home Medications    Prior to Admission medications   Medication Sig Start Date End Date Taking? Authorizing Provider  Prenatal Vit-Fe Fumarate-FA (PRENATAL MULTIVITAMIN) TABS tablet Take 1 tablet by mouth daily at 12 noon.   Yes Historical Provider, MD  albuterol (PROVENTIL) (2.5 MG/3ML) 0.083% nebulizer solution Take 2.5 mg by nebulization every 6 (six) hours as needed for wheezing or shortness of breath.    Historical Provider, MD  amoxicillin (AMOXIL) 875 MG tablet Take 1 tablet (875 mg total) by mouth 2 (two) times daily. Patient not taking: Reported on 03/25/2016 06/03/15   Payton Mccallum, MD  cefUROXime (CEFTIN) 250 MG tablet Take one tablet BID with food Patient not taking: Reported on 03/25/2016 07/28/15   Lutricia Feil, PA-C    chlorpheniramine-HYDROcodone [facility] ER) 10-8 MG/5ML SUER Take 5 mLs by mouth at bedtime as needed for cough. Patient not taking: Reported on 03/25/2016 06/03/15   Payton Mccallum, MD  fluticasone [facility]) 50 MCG/ACT nasal spray Place 2 sprays into both nostrils daily. Patient not taking: Reported on 03/25/2016 07/28/15   Lutricia Feil, PA-C  sertraline (ZOLOFT) 100 MG tablet Take 200 mg by mouth daily.     Historical Provider, MD    Family History History reviewed. No pertinent family history.  Social History Social History  Substance Use Topics  . Smoking status: Current Every Day Smoker    Types: Cigarettes  . Smokeless tobacco: Never Used  . Alcohol use No     Allergies   Sulfa antibiotics   Review of Systems Review of Systems: Negative except mentioned above.   Physical Exam Triage Vital Signs ED Triage Vitals  Enc Vitals Group     BP 04/03/16 1526 117/69     Pulse Rate 04/03/16 1526 (!) 110     Resp 04/03/16 1526 18     Temp 04/03/16 1526 98 F (36.7 C)     Temp Source 04/03/16 1526 Oral     SpO2 04/03/16 1526 100 %     Weight  04/03/16 1524 156 lb (70.8 kg)     Height 04/03/16 1524 5' (1.524 m)     Head Circumference --      Peak Flow --      Pain Score 04/03/16 1525 10     Pain Loc --      Pain Edu? --      Excl. in GC? --    No data found.   Updated Vital Signs BP 117/69 (BP Location: Left Arm)   Pulse (!) 110   Temp 98 F (36.7 C) (Oral)   Resp 18   Ht 5' (1.524 m)   Wt 156 lb (70.8 kg)   LMP 09/02/2015 (Approximate)   SpO2 100%   BMI 30.47 kg/m     Physical Exam:  GENERAL: NAD RESP: CTA B CARD: RRR GU: approx. 2 in x 2 in fluctuant mass along left inner thigh, area not extending to vulva or rectum NEURO: CN II-XII groslly intact    UC Treatments / Results  Labs (all labs ordered are listed, but only abnormal results are displayed) Labs Reviewed - No data to display  EKG  EKG Interpretation None        Radiology No results found.  Procedures Procedures (including critical care time)  Medications Ordered in UC Medications - No data to display   Initial Impression / Assessment and Plan / UC Course  I have reviewed the triage vital signs and the nursing notes.  Pertinent labs & imaging results that were available during my care of the patient were reviewed by me and considered in my medical decision making (see chart for details).  Clinical Course   A/P: Sebaceous cyst/abscess left inner thigh - verbal consent was obtained, rest benefits were discussed, and 11 blade was used to make the incision after lidocaine 1% without epi was used to anesthetize the area, sebaceous material/pus was expressed, Quarter inch packing was used, dressing was placed, advised patient to keep the area clean and dry, a culture was taken of the discharge, patient is allergic to sulfa, will place patient on Amoxicillin at this time, it will be important to look at the culture results in case the antibiotic needs to be changed. Given the fact that she is 7 months pregnant I do recommend that she follow up with OB/GYN tomorrow for recheck and repacking if needed. Patient addresses understanding. She will take Tylenol for pain if needed.  Final Clinical Impressions(s) / UC Diagnoses   Final diagnoses:  None    New Prescriptions New Prescriptions   No medications on file     Jolene Provost, MD 04/03/16 1615

## 2016-04-03 NOTE — Discharge Instructions (Signed)
Keep area clean and dry, change dressing on area 2-3 times daily, follow up with OB/GYN this week, seek medical attention immediately if any worsening symptoms.

## 2016-04-05 ENCOUNTER — Ambulatory Visit
Admission: EM | Admit: 2016-04-05 | Discharge: 2016-04-05 | Disposition: A | Discharge status: Home / Self Care | Payer: Medicaid Other | Attending: Family Medicine | Admitting: Family Medicine

## 2016-04-05 DIAGNOSIS — L0291 Cutaneous abscess, unspecified: Secondary | ICD-10-CM

## 2016-04-05 MED ORDER — AMOXICILLIN-POT CLAVULANATE 875-125 MG PO TABS: 1.0000 | ORAL_TABLET | Freq: Two times a day (BID) | ORAL | 0 refills | Status: DC

## 2016-04-05 NOTE — ED Triage Notes (Addendum)
Patient was seen here this week, and Dr Allena Katz lanced a cyst on her inner thigh on right leg near vaginal area. Patient is here for a follow up and she states that she is still having some pain in that area. She is 7 months pregnant.

## 2016-04-05 NOTE — ED Provider Notes (Signed)
[facility]    CSN: [id number] Arrival date & time: 04/05/16  1105  First Provider Contact:  None       History   Chief Complaint Chief Complaint  Patient presents with  . Wound Check    cyst inner thigh     HPI Lauren Tapia is a 30 y.o. female.   30 yo female seen here 3 days ago with a left upper inner thigh abscess which was incised and drained by Dr. Allena Katz. Patient here for follow up. States overall better but still having some pain/tenderness and mild drainage. Denies any fevers, chills.    The history is provided by the patient.    Past Medical History:  Diagnosis Date  . Asthma   . GAD (generalized anxiety disorder)   . Obsessive compulsive disorder   . OSA on CPAP   . PTSD (post-traumatic stress disorder)     Patient Active Problem List   Diagnosis Date Noted  . Ectopic fetus 10/12/2015    History reviewed. No pertinent surgical history.  OB History    Gravida Para Term Preterm AB Living   1             SAB TAB Ectopic Multiple Live Births                   Home Medications    Prior to Admission medications   Medication Sig Start Date End Date Taking? Authorizing Provider  albuterol (PROVENTIL) (2.5 MG/3ML) 0.083% nebulizer solution Take 2.5 mg by nebulization every 6 (six) hours as needed for wheezing or shortness of breath.    Historical Provider, MD  amoxicillin (AMOXIL) 500 MG capsule Take 1 capsule (500 mg total) by mouth 3 (three) times daily. 04/03/16   Jolene Provost, MD  amoxicillin-clavulanate (AUGMENTIN) 875-125 MG tablet Take 1 tablet by mouth 2 (two) times daily. 04/05/16   Payton Mccallum, MD  cefUROXime (CEFTIN) 250 MG tablet Take one tablet BID with food Patient not taking: Reported on 03/25/2016 07/28/15   Lutricia Feil, PA-C  chlorpheniramine-HYDROcodone [facility]) 10-8 MG/5ML SUER Take 5 mLs by mouth at bedtime as needed for cough. Patient not taking: Reported on 03/25/2016 06/03/15   Payton Mccallum, MD    fluticasone [facility]) 50 MCG/ACT nasal spray Place 2 sprays into both nostrils daily. Patient not taking: Reported on 03/25/2016 07/28/15   Lutricia Feil, PA-C  Prenatal Vit-Fe Fumarate-FA (PRENATAL MULTIVITAMIN) TABS tablet Take 1 tablet by mouth daily at 12 noon.    Historical Provider, MD  sertraline (ZOLOFT) 100 MG tablet Take 200 mg by mouth daily.     Historical Provider, MD    Family History History reviewed. No pertinent family history.  Social History Social History  Substance Use Topics  . Smoking status: Current Every Day Smoker    Types: Cigarettes  . Smokeless tobacco: Never Used  . Alcohol use No     Allergies   Sulfa antibiotics   Review of Systems Review of Systems   Physical Exam Triage Vital Signs ED Triage Vitals [04/05/16 1145]  Enc Vitals Group     BP 107/63     Pulse      Resp 18     Temp 98.1 F (36.7 C)     Temp Source Tympanic     SpO2      Weight 156 lb (70.8 kg)     Height 5' (1.524 m)     Head Circumference      Peak  Flow      Pain Score 5     Pain Loc      Pain Edu?      Excl. in GC?    No data found.   Updated Vital Signs BP 107/63 (BP Location: Left Arm)   Temp 98.1 F (36.7 C) (Tympanic)   Resp 18   Ht 5' (1.524 m)   Wt 156 lb (70.8 kg)   LMP 09/02/2015 (Approximate)   BMI 30.47 kg/m   Visual Acuity Right Eye Distance:   Left Eye Distance:   Bilateral Distance:    Right Eye Near:   Left Eye Near:    Bilateral Near:     Physical Exam  Skin:  Left inner thigh area with surgical incision and packed wound noted; mild tenderness around surrounding tissue; no drainage or erythema  Nursing note and vitals reviewed.    UC Treatments / Results  Labs (all labs ordered are listed, but only abnormal results are displayed) Labs Reviewed - No data to display  EKG  EKG Interpretation None       Radiology No results found.  Procedures Procedures (including critical care time)  Medications Ordered in  UC Medications - No data to display   Initial Impression / Assessment and Plan / UC Course  I have reviewed the triage vital signs and the nursing notes.  Pertinent labs & imaging results that were available during my care of the patient were reviewed by me and considered in my medical decision making (see chart for details).  Clinical Course     Final Clinical Impressions(s) / UC Diagnoses   Final diagnoses:  Abscess  (improving; resolving)  New Prescriptions Discharge Medication List as of 04/05/2016 12:34 PM    START taking these medications   Details  amoxicillin-clavulanate (AUGMENTIN) 875-125 MG tablet Take 1 tablet by mouth 2 (two) times daily., Starting Fri 04/05/2016, Normal       1. diagnosis reviewed with patient; no further packing required; explained to patient wound healing by secondary intention 2. rx as per orders above; reviewed possible side effects, interactions, risks and benefits  3. Recommend supportive treatment with warm compresses 4. Follow-up prn if symptoms worsen or don't improve   Payton Mccallum, MD 04/05/16 1319

## 2016-04-06 LAB — AEROBIC CULTURE W GRAM STAIN (SUPERFICIAL SPECIMEN): Culture: NORMAL

## 2016-04-06 LAB — AEROBIC CULTURE  (SUPERFICIAL SPECIMEN)

## 2016-04-25 ENCOUNTER — Observation Stay
Admission: EM | Admit: 2016-04-25 | Discharge: 2016-04-25 | Disposition: A | Discharge status: Home / Self Care | Payer: Medicaid Other | Attending: Obstetrics and Gynecology | Admitting: Obstetrics and Gynecology

## 2016-04-25 DIAGNOSIS — F17210 Nicotine dependence, cigarettes, uncomplicated: Secondary | ICD-10-CM | POA: Diagnosis not present

## 2016-04-25 DIAGNOSIS — Z3A33 33 weeks gestation of pregnancy: Secondary | ICD-10-CM | POA: Insufficient documentation

## 2016-04-25 DIAGNOSIS — M545 Low back pain: Secondary | ICD-10-CM | POA: Diagnosis not present

## 2016-04-25 DIAGNOSIS — R109 Unspecified abdominal pain: Secondary | ICD-10-CM | POA: Diagnosis not present

## 2016-04-25 DIAGNOSIS — O26893 Other specified pregnancy related conditions, third trimester: Secondary | ICD-10-CM | POA: Diagnosis not present

## 2016-04-25 DIAGNOSIS — O99333 Smoking (tobacco) complicating pregnancy, third trimester: Secondary | ICD-10-CM | POA: Insufficient documentation

## 2016-04-25 LAB — URINALYSIS COMPLETE WITH MICROSCOPIC (ARMC ONLY)
BACTERIA UA: NONE SEEN
Bilirubin Urine: NEGATIVE
GLUCOSE, UA: NEGATIVE mg/dL
Hgb urine dipstick: NEGATIVE
Leukocytes, UA: NEGATIVE
Nitrite: NEGATIVE
PROTEIN: NEGATIVE mg/dL
SPECIFIC GRAVITY, URINE: 1.01 (ref 1.005–1.030)
pH: 6 (ref 5.0–8.0)

## 2016-04-25 NOTE — Final Progress Note (Signed)
Physician Final Progress Note  Patient ID: Lauren Tapia MRN: [id number] DOB/AGE: 03-11-1986 30 y.o.  Admit date: 04/25/2016 Admitting provider: Conard Novak, MD Discharge date: 04/25/2016   Admission Diagnoses: abdominal pain (suprapubic)  Discharge Diagnoses:  abdominal pain (suprapubic), no evidence of labor  History of Present Illness: The patient is a 30 y.o. female G1P0 at [redacted]w[redacted]d who presents for abdominal pain today. She notes period-like cramping today and lower-back pain. She has had urinary frequency, also.  She notes no vaginal symptoms. She was recently on amoxicillin and then Augmentin for a thigh abscess up until 1 week ago.  She denies other urinary symptoms apart from frequency. She is concerned because her other two pregnancies delivered early (4 weeks in G1 and 2 weeks in G2).  She notes +FM, no LOF, no vaginal bleeding.  UA was normal. Cervix was essentially closed.  Discussed labor precautions. Patient discharged in stable condition.   Past Medical History:  Diagnosis Date  . Asthma   . GAD (generalized anxiety disorder)   . Hypertension   . Obsessive compulsive disorder   . OSA on CPAP   . PTSD (post-traumatic stress disorder)     History reviewed. No pertinent surgical history.  No current facility-administered medications on file prior to encounter.    Current Outpatient Prescriptions on File Prior to Encounter  Medication Sig Dispense Refill  . Prenatal Vit-Fe Fumarate-FA (PRENATAL MULTIVITAMIN) TABS tablet Take 1 tablet by mouth daily at 12 noon.    Marland Kitchen albuterol (PROVENTIL) (2.5 MG/3ML) 0.083% nebulizer solution Take 2.5 mg by nebulization every 6 (six) hours as needed for wheezing or shortness of breath.    Marland Kitchen amoxicillin (AMOXIL) 500 MG capsule Take 1 capsule (500 mg total) by mouth 3 (three) times daily. (Patient not taking: Reported on 04/25/2016) 21 capsule 0  . amoxicillin-clavulanate (AUGMENTIN) 875-125 MG tablet Take 1 tablet by mouth 2 (two) times  daily. (Patient not taking: Reported on 04/25/2016) 14 tablet 0  . cefUROXime (CEFTIN) 250 MG tablet Take one tablet BID with food (Patient not taking: Reported on 04/25/2016) 20 tablet 0  . chlorpheniramine-HYDROcodone (TUSSIONEX PENNKINETIC ER) 10-8 MG/5ML SUER Take 5 mLs by mouth at bedtime as needed for cough. (Patient not taking: Reported on 04/25/2016) 100 mL 0  . fluticasone (FLONASE) 50 MCG/ACT nasal spray Place 2 sprays into both nostrils daily. (Patient not taking: Reported on 04/25/2016) 16 g 0  . sertraline (ZOLOFT) 100 MG tablet Take 200 mg by mouth daily.       Allergies  Allergen Reactions  . Sulfa Antibiotics Other (See Comments)    Social History   Social History  . Marital status: Divorced    Spouse name: N/A  . Number of children: N/A  . Years of education: N/A   Occupational History  . Not on file.   Social History Main Topics  . Smoking status: Current Every Day Smoker    Types: Cigarettes  . Smokeless tobacco: Never Used  . Alcohol use No  . Drug use: No  . Sexual activity: Not on file   Other Topics Concern  . Not on file   Social History Narrative  . No narrative on file    Physical Exam: BP 114/78 (BP Location: Left Arm)   Pulse 78   Temp 98.1 F (36.7 C) (Oral)   Resp 19   Ht 5' (1.524 m)   Wt 152 lb (68.9 kg)   LMP 09/02/2015 (Approximate)   BMI 29.69 kg/m   Gen: NAD  CV: RRR Pulm: CTAB Pelvic:   CVX: 0.5/30/-3 Ext: no e/c/t  Consults: None  Significant Findings/ Diagnostic Studies:  Lab Results  Component Value Date   APPEARANCEUR CLEAR (A) 04/25/2016   GLUCOSEU NEGATIVE 04/25/2016   BILIRUBINUR NEGATIVE 04/25/2016   KETONESUR 1+ (A) 04/25/2016   LABSPEC 1.010 04/25/2016   HGBUR NEGATIVE 04/25/2016   PHURINE 6.0 04/25/2016   NITRITE NEGATIVE 04/25/2016   LEUKOCYTESUR NEGATIVE 04/25/2016   RBCU 0-5 04/25/2016   WBCU 0-5 04/25/2016   BACTERIA NONE SEEN 04/25/2016   EPIU 0-5 (A) 04/25/2016   MUCOUSUACOMP PRESENT 04/25/2016      Procedures: NST Baseline: 130 bpm Variability: moderate Accelerations: present Decelerations: absent Tocometry: irritability   Discharge Condition: stable  Disposition: 01-Home or Self Care  Diet: Regular diet  Discharge Activity: Activity as tolerated     Medication List    STOP taking these medications   amoxicillin 500 MG capsule Commonly known as:  AMOXIL   amoxicillin-clavulanate 875-125 MG tablet Commonly known as:  AUGMENTIN   cefUROXime 250 MG tablet Commonly known as:  CEFTIN   chlorpheniramine-HYDROcodone 10-8 MG/5ML Suer Commonly known as:  TUSSIONEX PENNKINETIC ER     TAKE these medications   albuterol (2.5 MG/3ML) 0.083% nebulizer solution Commonly known as:  PROVENTIL Take 2.5 mg by nebulization every 6 (six) hours as needed for wheezing or shortness of breath.   fluticasone 50 MCG/ACT nasal spray Commonly known as:  FLONASE Place 2 sprays into both nostrils daily.   prenatal multivitamin Tabs tablet Take 1 tablet by mouth daily at 12 noon.   sertraline 100 MG tablet Commonly known as:  ZOLOFT Take 200 mg by mouth daily.        Total time spent taking care of this patient: 30 minutes  Signed: Conard Novak, MD  04/25/2016, 2:46 PM

## 2016-04-25 NOTE — Discharge Summary (Signed)
Pt d/c'd to home in stable condition. Verbalized understanding of d/c instructions. Future appointment made.

## 2016-04-25 NOTE — OB Triage Note (Signed)
Patient came in for observation for contractions and lower back pain. Patient denies leaking of fluid but denies vaginal bleeding and spotting. Vital signs stable and patient afebrile. Patient stated back pain started around 0700 this morning. FHR baseline 135 with moderate variability with accelerations 15 x 15 and no decelerations. Significant at bedside. Will continue to monitor.

## 2016-04-26 LAB — URINE CULTURE: CULTURE: NO GROWTH

## 2016-05-11 ENCOUNTER — Inpatient Hospital Stay
Admission: EM | Admit: 2016-05-11 | Discharge: 2016-05-13 | DRG: 775 | Disposition: A | Discharge status: Home / Self Care | Payer: Medicaid Other | Attending: Obstetrics & Gynecology | Admitting: Obstetrics & Gynecology

## 2016-05-11 ENCOUNTER — Encounter: Payer: Self-pay | Admitting: *Deleted

## 2016-05-11 DIAGNOSIS — F329 Major depressive disorder, single episode, unspecified: Secondary | ICD-10-CM | POA: Diagnosis present

## 2016-05-11 DIAGNOSIS — Z3A35 35 weeks gestation of pregnancy: Secondary | ICD-10-CM | POA: Diagnosis not present

## 2016-05-11 DIAGNOSIS — O9902 Anemia complicating childbirth: Secondary | ICD-10-CM | POA: Diagnosis present

## 2016-05-11 DIAGNOSIS — O9952 Diseases of the respiratory system complicating childbirth: Secondary | ICD-10-CM | POA: Diagnosis present

## 2016-05-11 DIAGNOSIS — R109 Unspecified abdominal pain: Secondary | ICD-10-CM

## 2016-05-11 DIAGNOSIS — O26899 Other specified pregnancy related conditions, unspecified trimester: Secondary | ICD-10-CM

## 2016-05-11 DIAGNOSIS — Z882 Allergy status to sulfonamides status: Secondary | ICD-10-CM

## 2016-05-11 DIAGNOSIS — J45909 Unspecified asthma, uncomplicated: Secondary | ICD-10-CM | POA: Diagnosis present

## 2016-05-11 DIAGNOSIS — D649 Anemia, unspecified: Secondary | ICD-10-CM | POA: Diagnosis present

## 2016-05-11 DIAGNOSIS — O99344 Other mental disorders complicating childbirth: Secondary | ICD-10-CM | POA: Diagnosis present

## 2016-05-11 HISTORY — DX: Gestational (pregnancy-induced) hypertension without significant proteinuria, unspecified trimester: O13.9

## 2016-05-11 LAB — CBC
HCT: 31.8 % — ABNORMAL LOW (ref 35.0–47.0)
HEMOGLOBIN: 10.8 g/dL — AB (ref 12.0–16.0)
MCH: 33.5 pg (ref 26.0–34.0)
MCHC: 34.1 g/dL (ref 32.0–36.0)
MCV: 98.4 fL (ref 80.0–100.0)
PLATELETS: 323 10*3/uL (ref 150–440)
RBC: 3.24 MIL/uL — AB (ref 3.80–5.20)
RDW: 14 % (ref 11.5–14.5)
WBC: 11.8 10*3/uL — AB (ref 3.6–11.0)

## 2016-05-11 LAB — URINALYSIS COMPLETE WITH MICROSCOPIC (ARMC ONLY)
BILIRUBIN URINE: NEGATIVE
Glucose, UA: NEGATIVE mg/dL
Hgb urine dipstick: NEGATIVE
Nitrite: NEGATIVE
PH: 6 (ref 5.0–8.0)
PROTEIN: 30 mg/dL — AB
Specific Gravity, Urine: 1.018 (ref 1.005–1.030)

## 2016-05-11 LAB — TYPE AND SCREEN
ABO/RH(D): O POS
Antibody Screen: NEGATIVE

## 2016-05-11 MED ORDER — AMPICILLIN SODIUM 2 G IJ SOLR
2.0000 g | Freq: Once | INTRAMUSCULAR | Status: AC
  Administered 2016-05-11: 2 g via INTRAVENOUS

## 2016-05-11 MED ORDER — ACETAMINOPHEN 325 MG PO TABS: 650.0000 mg | ORAL_TABLET | ORAL | Status: DC | PRN

## 2016-05-11 MED ORDER — SODIUM CHLORIDE 0.9 % IV SOLN
1.0000 g | INTRAVENOUS | Status: DC
  Administered 2016-05-11 – 2016-05-12 (×2): 1 g via INTRAVENOUS
  Filled 2016-05-11 (×10): qty 1000

## 2016-05-11 MED ORDER — BETAMETHASONE SOD PHOS & ACET 6 (3-3) MG/ML IJ SUSP
12.0000 mg | INTRAMUSCULAR | Status: DC
  Administered 2016-05-11: 12 mg via INTRAMUSCULAR
  Filled 2016-05-11 (×2): qty 2

## 2016-05-11 MED ORDER — LIDOCAINE HCL (PF) 1 % IJ SOLN
INTRAMUSCULAR | Status: AC
  Filled 2016-05-11: qty 30

## 2016-05-11 MED ORDER — AMPICILLIN SODIUM 2 G IJ SOLR
INTRAMUSCULAR | Status: AC
  Filled 2016-05-11: qty 2000

## 2016-05-11 MED ORDER — LACTATED RINGERS IV SOLN
INTRAVENOUS | Status: DC
  Administered 2016-05-11: 23:00:00 via INTRAVENOUS

## 2016-05-11 MED ORDER — LACTATED RINGERS IV SOLN: 500.0000 mL | INTRAVENOUS | Status: DC | PRN

## 2016-05-11 MED ORDER — OXYTOCIN 10 UNIT/ML IJ SOLN
INTRAMUSCULAR | Status: AC
  Filled 2016-05-11: qty 2

## 2016-05-11 MED ORDER — BUTORPHANOL TARTRATE 1 MG/ML IJ SOLN: 1.0000 mg | INTRAMUSCULAR | Status: DC | PRN

## 2016-05-11 MED ORDER — OXYTOCIN 40 UNITS IN LACTATED RINGERS INFUSION - SIMPLE MED
INTRAVENOUS | Status: AC
  Administered 2016-05-12: 1 m[IU]/min via INTRAVENOUS
  Filled 2016-05-11: qty 1000

## 2016-05-11 MED ORDER — ONDANSETRON HCL 4 MG/2ML IJ SOLN: 4.0000 mg | Freq: Four times a day (QID) | INTRAMUSCULAR | Status: DC | PRN

## 2016-05-11 MED ORDER — AMMONIA AROMATIC IN INHA
RESPIRATORY_TRACT | Status: AC
  Filled 2016-05-11: qty 10

## 2016-05-11 MED ORDER — MISOPROSTOL 200 MCG PO TABS
ORAL_TABLET | ORAL | Status: AC
  Filled 2016-05-11: qty 4

## 2016-05-11 MED ORDER — OXYTOCIN 40 UNITS IN LACTATED RINGERS INFUSION - SIMPLE MED: 2.5000 [IU]/h | INTRAVENOUS | Status: DC

## 2016-05-11 MED ORDER — OXYTOCIN BOLUS FROM INFUSION: 500.0000 mL | Freq: Once | INTRAVENOUS | Status: DC

## 2016-05-11 NOTE — OB Triage Note (Signed)
Presents with complaints of some contractions today since 2pm and some pressure in vagina. Denies any bleeding.

## 2016-05-11 NOTE — H&P (Signed)
Obstetrics Admission History & Physical   PP:IRJJOAC Contractions   HPI:  30 y.o. [id number] @ [redacted]w[redacted]d (06/10/2016, by Last Menstrual Period). Admitted on 05/11/2016:   Patient Active Problem List   Diagnosis Date Noted  . Abdominal pain affecting pregnancy 05/11/2016  . Normal labor 05/11/2016  . Indication for care in labor and delivery, antepartum 04/25/2016  . Ectopic fetus 10/12/2015     Presents for painful ctxs, no VB or ROM.  No compl this preg.  Prior PTD 36 weeks.     H/o cHTN but no meds needed this pregnancy    Anemia, Depression (Zoloft), desires pp BTL. Prenatal care at: at [facility]  PMHx:  Past Medical History:  Diagnosis Date  . Asthma   . GAD (generalized anxiety disorder)   . Hypertension   . Obsessive compulsive disorder   . OSA on CPAP   . PTSD (post-traumatic stress disorder)    PSHx: History reviewed. No pertinent surgical history. Medications:  Prescriptions Prior to Admission  Medication Sig Dispense Refill Last Dose  . albuterol (PROVENTIL) (2.5 MG/3ML) 0.083% nebulizer solution Take 2.5 mg by nebulization every 6 (six) hours as needed for wheezing or shortness of breath.   05/11/2016 at Unknown time  . ferrous sulfate 325 (65 FE) MG EC tablet Take 325 mg by mouth daily with breakfast.   05/11/2016 at Unknown time  . Prenatal Vit-Fe Fumarate-FA (PRENATAL MULTIVITAMIN) TABS tablet Take 1 tablet by mouth daily at 12 noon.   05/11/2016 at Unknown time  . fluticasone (FLONASE) 50 MCG/ACT nasal spray Place 2 sprays into both nostrils daily. (Patient not taking: Reported on 05/11/2016) 16 g 0 Unknown at Unknown time  . sertraline (ZOLOFT) 100 MG tablet Take 200 mg by mouth daily.    Unknown at Unknown time   Allergies: is allergic to sulfa antibiotics. OBHx:  OB History  Gravida Para Term Preterm AB Living  3 2 1 1   2   SAB TAB Ectopic Multiple Live Births               # Outcome Date GA Lbr Len/2nd Weight Sex Delivery Anes PTL Lv  3 Current           2  Preterm           1 Term              except as detailed in HPI. Soc Hx: Never smoker, Alcohol: none, Recreational drug use: none and Denies domestic abuse  Objective:   Vitals:   05/11/16 1756 05/11/16 1934  BP:  108/74  Pulse:  66  Resp: 18 18  Temp: 98.5 F (36.9 C) 98.1 F (36.7 C)   General: Well nourished, well developed female in no acute distress.  Skin: Warm and dry.  Cardiovascular:Regular rate and rhythm. Respiratory: Clear to auscultation bilateral. Normal respiratory effort Abdomen: mild Neuro/Psych: Normal mood and affect.   Pelvic exam: is not limited by body habitus EGBUS: within normal limits Vagina: within normal limits and with normal mucosa blood in the vault Cervix: 5/70/-2 Uterus: Spontaneous uterine activity  Adnexa: not evaluated  EFM:FHR: 140 bpm, variability: moderate,  accelerations:  Present,  decelerations:  Absent Toco: Frequency: Every 5 minutes   Perinatal info:  Blood type: O positive Rubella- Immune Varicella -Immune TDaP Given during third trimester of this pregnancy RPR NR / HIV Neg/ HBsAg Neg   Assessment & Plan:   30 y.o. 34 @ [redacted]w[redacted]d, Admitted on 05/11/2016:Early Labor    Admit  for labor, Antibiotics for GBS prophylaxis, Fetal Wellbeing Reassuring and AROM when Appropriate

## 2016-05-12 LAB — CBC
HEMATOCRIT: 31.1 % — AB (ref 35.0–47.0)
Hemoglobin: 11 g/dL — ABNORMAL LOW (ref 12.0–16.0)
MCH: 34.2 pg — ABNORMAL HIGH (ref 26.0–34.0)
MCHC: 35.5 g/dL (ref 32.0–36.0)
MCV: 96.5 fL (ref 80.0–100.0)
PLATELETS: 295 10*3/uL (ref 150–440)
RBC: 3.23 MIL/uL — AB (ref 3.80–5.20)
RDW: 14.1 % (ref 11.5–14.5)
WBC: 18.5 10*3/uL — AB (ref 3.6–11.0)

## 2016-05-12 MED ORDER — ONDANSETRON HCL 4 MG PO TABS: 4.0000 mg | ORAL_TABLET | ORAL | Status: DC | PRN

## 2016-05-12 MED ORDER — SODIUM CHLORIDE 0.9% FLUSH: 3.0000 mL | INTRAVENOUS | Status: DC | PRN

## 2016-05-12 MED ORDER — SENNOSIDES-DOCUSATE SODIUM 8.6-50 MG PO TABS
2.0000 | ORAL_TABLET | ORAL | Status: DC
  Administered 2016-05-12: 2 via ORAL
  Filled 2016-05-12: qty 2

## 2016-05-12 MED ORDER — TERBUTALINE SULFATE 1 MG/ML IJ SOLN: 0.2500 mg | Freq: Once | INTRAMUSCULAR | Status: DC | PRN

## 2016-05-12 MED ORDER — SODIUM CHLORIDE 0.9% FLUSH: 3.0000 mL | Freq: Two times a day (BID) | INTRAVENOUS | Status: DC

## 2016-05-12 MED ORDER — SODIUM CHLORIDE 0.9 % IV SOLN: 250.0000 mL | INTRAVENOUS | Status: DC | PRN

## 2016-05-12 MED ORDER — IBUPROFEN 600 MG PO TABS
600.0000 mg | ORAL_TABLET | Freq: Four times a day (QID) | ORAL | Status: DC
  Administered 2016-05-12 – 2016-05-13 (×5): 600 mg via ORAL
  Filled 2016-05-12 (×5): qty 1

## 2016-05-12 MED ORDER — DIPHENHYDRAMINE HCL 25 MG PO CAPS: 25.0000 mg | ORAL_CAPSULE | Freq: Four times a day (QID) | ORAL | Status: DC | PRN

## 2016-05-12 MED ORDER — OXYTOCIN 40 UNITS IN LACTATED RINGERS INFUSION - SIMPLE MED
1.0000 m[IU]/min | INTRAVENOUS | Status: DC
  Administered 2016-05-12: 1 m[IU]/min via INTRAVENOUS

## 2016-05-12 MED ORDER — WITCH HAZEL-GLYCERIN EX PADS: 1.0000 "application " | MEDICATED_PAD | CUTANEOUS | Status: DC | PRN

## 2016-05-12 MED ORDER — ACETAMINOPHEN 325 MG PO TABS: 650.0000 mg | ORAL_TABLET | ORAL | Status: DC | PRN

## 2016-05-12 MED ORDER — OXYCODONE-ACETAMINOPHEN 5-325 MG PO TABS: 2.0000 | ORAL_TABLET | ORAL | Status: DC | PRN

## 2016-05-12 MED ORDER — ONDANSETRON HCL 4 MG/2ML IJ SOLN: 4.0000 mg | INTRAMUSCULAR | Status: DC | PRN

## 2016-05-12 MED ORDER — BENZOCAINE-MENTHOL 20-0.5 % EX AERO: 1.0000 "application " | INHALATION_SPRAY | CUTANEOUS | Status: DC | PRN

## 2016-05-12 MED ORDER — SIMETHICONE 80 MG PO CHEW: 80.0000 mg | CHEWABLE_TABLET | ORAL | Status: DC | PRN

## 2016-05-12 MED ORDER — DIBUCAINE 1 % RE OINT: 1.0000 "application " | TOPICAL_OINTMENT | RECTAL | Status: DC | PRN

## 2016-05-12 MED ORDER — OXYCODONE-ACETAMINOPHEN 5-325 MG PO TABS: 1.0000 | ORAL_TABLET | ORAL | Status: DC | PRN

## 2016-05-12 MED ORDER — ZOLPIDEM TARTRATE 5 MG PO TABS: 5.0000 mg | ORAL_TABLET | Freq: Every evening | ORAL | Status: DC | PRN

## 2016-05-12 MED ORDER — COCONUT OIL OIL: 1.0000 "application " | TOPICAL_OIL | Status: DC | PRN

## 2016-05-12 NOTE — Progress Notes (Signed)
  Labor Progress Note   30 y.o. [id number] @ [redacted]w[redacted]d , admitted for  Pregnancy, Labor Management.   Subjective:  Mild pain.  Slow progression and now protracted labor despite reg ctxs. 2 does Ampicillin in. BMZ.  Objective:  BP 108/67   Pulse 76   Temp 98.1 F (36.7 C) (Oral)   Resp 18   Ht 5' (1.524 m)   Wt 154 lb (69.9 kg)   LMP 09/04/2015   BMI 30.08 kg/m  Abd: mild Extr: trace to 1+ bilateral pedal edema SVE: CERVIX: 5-6 cm dilated, 60 effaced, -3 station  EFM: FHR: 140 bpm, variability: minimal ,  accelerations:  Present,  decelerations:  Absent Toco: Frequency: Every 3-6 minutes  Assessment & Plan:  [id number] @ [redacted]w[redacted]d, admitted for  Pregnancy and Labor/Delivery Management  1. Pain management: none. 2. FWB: FHT category 1.  3. ID: GBS not done 4. Labor management: Pitocin.  No benefit to further exp mgt as ABX are in and one dose BMZ given.    All discussed with patient, see orders

## 2016-05-12 NOTE — Progress Notes (Signed)
Pt has decided to cancel BTL for 05/13/16.  Dr. Tiburcio Pea paged to notify.

## 2016-05-12 NOTE — Progress Notes (Signed)
Admit Date: 05/11/2016 Today's Date: 05/12/2016  Post Partum Day 0  Subjective:  no complaints, up ad lib, voiding and tolerating PO  Objective: Temp:  [97.7 F (36.5 C)-98.5 F (36.9 C)] 97.7 F (36.5 C) (10/15 0710) Pulse Rate:  [62-76] 65 (10/15 0710) Resp:  [18] 18 (10/15 0710) BP: (108-116)/(67-74) 109/70 (10/15 0710) SpO2:  [100 %] 100 % (10/15 0710) Weight:  [154 lb (69.9 kg)] 154 lb (69.9 kg) (10/14 1934)  Physical Exam:  General: alert, cooperative and no distress Lochia: appropriate Uterine Fundus: firm Incision: none DVT Evaluation: No evidence of DVT seen on physical exam.   Recent Labs  05/11/16 1848 05/12/16 0802  HGB 10.8* 11.0*  HCT 31.8* 31.1*    Assessment/Plan: Breastfeeding and Infant doing well  Plans and desires pp BTL, although she says she may change her mind, so will need to re-eval in am prior to doing procedure (scheduled for Monday).   LOS: 1 day   Letitia Libra [facility] 05/12/2016, 11:45 AM

## 2016-05-12 NOTE — Discharge Summary (Signed)
Obstetrical Discharge Summary  Date of Admission: 05/11/2016 Date of Discharge: 05/13/16 Discharge Diagnosis: Premature labor Primary OB:  [facility]   Gestational Age at Delivery: [redacted]w[redacted]d  Antepartum complications: history of prematurity Date of Delivery: 05/12/2016  Delivered By: Annamarie Major, MD Delivery Type: spontaneous vaginal delivery Intrapartum complications/course: Preterm Labor Anesthesia: none Placenta: spontaneous Laceration: none Episiotomy: none Live born [place]  Birth Weight:  4-13 APGAR: 9, 9   Post partum course: Since the delivery, patient has tolerate activity, diet, and daily functions without difficulty or complication.  Min lochia.  No breast concerns at this time.  No signs of depression currently.   Postpartum Exam:General appearance: alert and cooperative GI: soft, non-tender; bowel sounds normal; no masses,  no organomegaly Extremities: extremities normal, atraumatic, no cyanosis or edema Fundus: Firm at U Lochia: minimal Lungs: CTAB Abdomen: soft, NT   Disposition: home with infant Rh Immune globulin given: not applicable Rubella vaccine given: not applicable Varicella vaccine given: not applicable Tdap vaccine given in AP or PP setting: given during prenatal care Flu vaccine given in AP or PP setting: given during prenatal care Contraception: considering interval BTL, Depo if decides against it  Prenatal Labs: O POS//Rubella Immune/Varicella Immune/RPR negative//HIV negative/HepB Surface Ag negative//plans to breastfeed  Plan:  Lauren Tapia was discharged to home in good condition. Follow-up appointment with [facility] provider in 6 weeks  Discharge Medications:   Medication List    STOP taking these medications   ferrous sulfate 325 (65 FE) MG EC tablet     TAKE these medications   albuterol (2.5 MG/3ML) 0.083% nebulizer solution Commonly known as:  PROVENTIL Take 2.5 mg by nebulization every 6 (six) hours as needed for wheezing or  shortness of breath.   fluticasone 50 MCG/ACT nasal spray Commonly known as:  FLONASE Place 2 sprays into both nostrils daily.   prenatal multivitamin Tabs tablet Take 1 tablet by mouth daily at 12 noon.   sertraline 100 MG tablet Commonly known as:  ZOLOFT Take 1/2 tablet by mouth daily x 5-6 days, then take 1 tablet by mouth daily. After 7 days, may increased to 2 tablets by mouth daily What changed:  how much to take  how to take this  when to take this  additional instructions       Follow-up Information    Letitia Libra, MD Follow up in 6 week(s).   Specialty:  Obstetrics and Gynecology Contact information: [address] [place] [address] [phone]           Marta Antu, [place] 05/13/16  9:55 AM

## 2016-05-13 LAB — RPR, QUANT+TP ABS (REFLEX)
Rapid Plasma Reagin, Quant: 1:1 {titer} — ABNORMAL HIGH
TREPONEMA PALLIDUM AB: NEGATIVE

## 2016-05-13 LAB — RPR: RPR: REACTIVE — AB

## 2016-05-13 MED ORDER — SERTRALINE HCL 100 MG PO TABS: ORAL_TABLET | ORAL | 11 refills | Status: DC

## 2016-05-13 NOTE — Progress Notes (Signed)
Patient discharged home with infant. Vital signs stable, bleeding within normal limits, uterus firm. Discharge instructions, prescriptions, and follow up appointment given to and reviewed with patient. Patient verbalized understanding, all questions answered. Escorted in wheelchair by auxiliary.    Areta Terwilliger, RN  

## 2016-05-13 NOTE — Discharge Instructions (Signed)
Vaginal Delivery, Care After °Refer to this sheet in the next few weeks. These discharge instructions provide you with information on caring for yourself after delivery. Your health care provider may also give you specific instructions. Your treatment has been planned according to the most current medical practices available, but problems sometimes occur. Call your health care provider if you have any problems or questions after you go home. °HOME CARE INSTRUCTIONS °· Take over-the-counter or prescription medicines only as directed by your health care provider or pharmacist. °· Do not drink alcohol, especially if you are breastfeeding or taking medicine to relieve pain. °· Do not chew or smoke tobacco. °· Do not use illegal drugs. °· Continue to use good perineal care. Good perineal care includes: °¨ Wiping your perineum from front to back. °¨ Keeping your perineum clean. °· Do not use tampons or douche for the next 6 weeks.  °· Showers only, no tub baths for the next 6 weeks.  °· Wear a well-fitting bra that provides breast support. °· Eat healthy foods. °· Drink enough fluids to keep your urine clear or pale yellow. °· Eat high-fiber foods such as whole grain cereals and breads, brown rice, beans, and fresh fruits and vegetables every day. These foods may help prevent or relieve constipation. °· Follow your health care provider's recommendations regarding resumption of activities such as climbing stairs, driving, lifting, exercising, or traveling. °· No sexual intercourse for at least the next 6 weeks and then not until you feel ready.  °· Try to have someone help you with your household activities and your newborn for at least a few days after you leave the hospital. °· Rest as much as possible. Try to rest or take a nap when your newborn is sleeping. °· Increase your activities gradually. °· Keep all of your scheduled postpartum appointments. It is very important to keep your scheduled follow-up appointments. At  these appointments, your health care provider will be checking to make sure that you are healing physically and emotionally. °SEEK MEDICAL CARE IF:  °· You are passing large clots from your vagina. Save any clots to show your health care provider. °· You have a foul smelling discharge from your vagina. °· You have trouble urinating. °· You are urinating frequently. °· You have pain when you urinate. °· You have a change in your bowel movements. °· You have increasing redness, pain, or swelling near your vaginal incision (episiotomy) or vaginal tear. °· You have pus draining from your episiotomy or vaginal tear. °· Your episiotomy or vaginal tear is separating. °· You have painful, hard, or reddened breasts. °· You have a severe headache. °· You have blurred vision or see spots. °· You feel sad or depressed. °· You have thoughts of hurting yourself or your newborn. °· You have questions about your care, the care of your newborn, or medicines. °· You are dizzy or light-headed. °· You have a rash. °· You have nausea or vomiting. °· You were breastfeeding and have not had a menstrual period within 12 weeks after you stopped breastfeeding. °· You are not breastfeeding and have not had a menstrual period by the 12th week after delivery. °· You have a fever. °SEEK IMMEDIATE MEDICAL CARE IF:  °· You have persistent pain. °· You have chest pain. °· You have shortness of breath. °· You faint. °· You have leg pain. °· You have stomach pain. °· Your vaginal bleeding saturates two or more sanitary pads in 1 hour. °  °  This information is not intended to replace advice given to you by your health care provider. Make sure you discuss any questions you have with your health care provider. °  °Document Released: 07/12/2000 Document Revised: 04/05/2015 Document Reviewed: 03/11/2012 °Elsevier Interactive Patient Education ©2016 [employer] ° °

## 2016-05-13 NOTE — Clinical Social Work Maternal (Signed)
  CLINICAL SOCIAL WORK MATERNAL/CHILD NOTE  Patient Details  Name: Lauren Tapia MRN: [id number] Date of Birth: 1986/07/12  Date:  05/13/2016  Clinical Social Worker Initiating Note:  Lilly Cove, LCSW Date/ Time Initiated:  05/13/16/1048     Child's Name:      Legal Guardian:  Mother   Need for Interpreter:  None   Date of Referral:  05/13/16     Reason for Referral:  Other (Comment) (community support/resources)   Referral Source:  RN   Address:     Phone number:      Household Members:  Minor Children   Natural Supports (not living in the home):  Extended Family, Immediate Family   Professional Supports:   none needed at this time.  Discussed counseling for older daughter per MOB's request.   Employment: Part-time   Type of Work: unsure if she is going back to work at this time.   Education:  Database administrator Resources:  Medicaid   Other Resources:  WIC, Physicist, medical  (Medicaid)   Cultural/Religious Considerations Which May Impact Care:  none reported  Strengths:  Ability to meet basic needs , Compliance with medical plan , Home prepared for child , Pediatrician chosen    Risk Factors/Current Problems:  None   Cognitive State:  Able to Concentrate , Alert , Insightful    Mood/Affect:  Bright , Calm , Comfortable    CSW Assessment: LCSW received consult for community resources, food and clothing for baby.  LCSW met with MOB in room as she is getting ready for discharge. She was engaging and open to assessment of needs.  LCSW explained role and reason for consult. MOB reports she listed on her sheet she may need help with food for baby and clothing, but has basic needs for baby in the home including car seat and safe sleep.  LCSW educated MOB about Medicaid, [place] and Physicist, medical. Discussed follow up with Peds MD and MOB reports she has chosen [facility] and has appointment arranged. She reports positive family support and wanting to  breast feed, but her milk has not come in yet, but she plans on trying. MOB has 2 other children in the home 6 and 37 year old.  Reports her 27 year old daughter is having a "midlife crisis", due to another girl being in the home and having difficulty with the birth.  LCSW explained different interventions such as giving her 32 year old a role and purpose.  Discussed special time with mom and outings so she feels wanted and loved.  MOB was open to interventions and appreciative. Reports FOB has limited involvement and unsure of his role as baby grows.  No other needs warranted at this time.  LCSW will sign off as all information given to MOB and she reports she is ready to go home after baby passes car seat test.  CSW Plan/Description:  Information/Referral to [employer] , [employer] , No Further Intervention Required/No Barriers to Discharge    Lilly Cove, LCSW 05/13/2016, 10:50 AM

## 2016-07-29 NOTE — L&D Delivery Note (Signed)
Pt transferred to [facility] by [facility] in stable conditions

## 2016-07-29 NOTE — L&D Delivery Note (Signed)
Date of delivery: 02/17/2017 Estimated Date of Delivery: 04/19/17 EGA: [redacted]w[redacted]d    The patient is here today following delivery of both twins. The first baby was born this afternoon at 4 while she was at a clinic appointment for her older daughter in [place]. Her contractions began around 3:30. Baby A was born vertex in the clinic bathroom with 2 clinic staff assisting. Per patient membranes were intact until following delivery. Paramedics were on the way to the clinic at the time of delivery of Baby A. Baby B was born feet first at 1714 en route just before arrival to the ER. The placenta was delivered in the ER spontaneous, intact with marginal insertion of both cords.  EBL unknown, appeared to be less than 500 ml Perineum intact, no lacerations Hemostasis attained with fundal massage and IV pitocin    The babies are in special care nursery.  The placenta appears complete with marginal insertion of both cords and is sent to pathology.  Tresea Mall, CNM

## 2016-07-29 NOTE — L&D Delivery Note (Signed)
Dr. Jean Rosenthal present SVE 3/50/-3, no change

## 2016-08-23 ENCOUNTER — Encounter: Payer: Self-pay | Admitting: Emergency Medicine

## 2016-08-23 ENCOUNTER — Ambulatory Visit
Admission: EM | Admit: 2016-08-23 | Discharge: 2016-08-23 | Disposition: A | Discharge status: Home / Self Care | Payer: Medicaid Other | Attending: Family Medicine | Admitting: Family Medicine

## 2016-08-23 DIAGNOSIS — H6591 Unspecified nonsuppurative otitis media, right ear: Secondary | ICD-10-CM

## 2016-08-23 DIAGNOSIS — H6121 Impacted cerumen, right ear: Secondary | ICD-10-CM | POA: Diagnosis not present

## 2016-08-23 DIAGNOSIS — R0981 Nasal congestion: Secondary | ICD-10-CM | POA: Diagnosis not present

## 2016-08-23 MED ORDER — AMOXICILLIN 875 MG PO TABS: 875.0000 mg | ORAL_TABLET | Freq: Two times a day (BID) | ORAL | 0 refills | Status: DC

## 2016-08-23 NOTE — ED Provider Notes (Signed)
[facility] ____________________________________________  Time seen: Approximately 8:43 AM  I have reviewed the triage vital signs and the nursing notes.   HISTORY  Chief Complaint Otalgia   HPI Lauren Tapia is a 31 y.o. female  presenting for the complaint of right ear pain for the last 3 days. Patient reports both of her ears feel clogged, "like a wax buildup ", but states she has been having intermittent pain in her right ear. Patient states pain is mild and comes and goes. Denies any persistent pain. Patient is occasionally the pain radiates to around her ear. Denies any pain to touch her right ear or around the right ear. Denies any trauma or injury. Denies decreased hearing or tinnitus. Denies any drainage from the ear. Denies any redness, swelling or skin changes surrounding ears. Patient reports she does have chronic seasonal allergies and has been having runny nose for the last several days consistent with her same allergies. Denies cough. Denies fevers. Patient reports otherwise feels well.  Denies chest pain, shortness of breath, abdominal pain, dysuria, extremity pain, extremity swelling or rash. Denies vaginal complaints vaginal bleeding. Denies recent sickness. Denies recent antibiotic use.   Patient's last menstrual period was 07/05/2016 (approximate). Reports [redacted] weeks pregnant. Denies pregnancy complaints.  [facility] PCP   Past Medical History:  Diagnosis Date  . Asthma   . GAD (generalized anxiety disorder)   . Obsessive compulsive disorder   . OSA on CPAP   . Pregnancy induced hypertension    with first pregnancy   . PTSD (post-traumatic stress disorder)     Patient Active Problem List   Diagnosis Date Noted  . Preterm delivery 05/12/2016  . Abdominal pain affecting pregnancy 05/11/2016  . Normal labor 05/11/2016  . Indication for care in labor and delivery, antepartum 04/25/2016  . Ectopic fetus 10/12/2015    History  reviewed. No pertinent surgical history.   No current facility-administered medications for this encounter.   Current Outpatient Prescriptions:  .  albuterol (PROVENTIL) (2.5 MG/3ML) 0.083% nebulizer solution, Take 2.5 mg by nebulization every 6 (six) hours as needed for wheezing or shortness of breath., Disp: , Rfl:  .  amoxicillin (AMOXIL) 875 MG tablet, Take 1 tablet (875 mg total) by mouth 2 (two) times daily., Disp: 20 tablet, Rfl: 0 .  Prenatal Vit-Fe Fumarate-FA (PRENATAL MULTIVITAMIN) TABS tablet, Take 1 tablet by mouth daily at 12 noon., Disp: , Rfl:   Allergies Sulfa antibiotics  Family History  Problem Relation Age of Onset  . Asthma Mother   . Drug abuse Mother   . Early death Mother   . Diabetes Maternal Aunt   . Hypertension Maternal Aunt   . Drug abuse Maternal Aunt   . Drug abuse Maternal Uncle   . Hypertension Maternal Grandmother   . Heart disease Maternal Grandmother   . Hyperlipidemia Maternal Grandmother   . Stroke Maternal Grandmother     Social History Social History  Substance Use Topics  . Smoking status: Current Every Day Smoker    Packs/day: 0.25    Types: Cigarettes  . Smokeless tobacco: Never Used  . Alcohol use 0.0 - 60.0 oz/week    Review of Systems Constitutional: No fever/chills Eyes: No visual changes. ENT: No sore throat.As above. Cardiovascular: Denies chest pain. Respiratory: Denies shortness of breath. Gastrointestinal: No abdominal pain.  No nausea, no vomiting.  No diarrhea.  No constipation. Genitourinary: Negative for dysuria. Musculoskeletal: Negative for back pain. Skin: Negative for rash. Neurological: Negative  for headaches, focal weakness or numbness.  10-point ROS otherwise negative.  ____________________________________________   PHYSICAL EXAM:  VITAL SIGNS: ED Triage Vitals  Enc Vitals Group     BP 08/23/16 0821 129/82     Pulse Rate 08/23/16 0821 71     Resp 08/23/16 0821 16     Temp 08/23/16 0821 98.2  F (36.8 C)     Temp Source 08/23/16 0821 Oral     SpO2 08/23/16 0821 100 %     Weight 08/23/16 0819 155 lb (70.3 kg)     Height 08/23/16 0819 5' (1.524 m)     Head Circumference --      Peak Flow --      Pain Score 08/23/16 0821 6     Pain Loc --      Pain Edu? --      Excl. in GC? --    Constitutional: Alert and oriented. Well appearing and in no acute distress. Eyes: Conjunctivae are normal. PERRL. EOMI. Head: Atraumatic. No sinus tenderness to palpation. No swelling. No erythema.  Ears: Left: Nontender, minimal erythema with small effusion, otherwise TM appears normal. Right: Nontender to palpation or auricle movement. Cerum impaction present and removed with curette, post cerumen removal canal clear. Moderate erythema with small effusion present, otherwise TM appears normal. Bilateral ears with normal appearance, no auricle displacement. No erythema, tenderness, swelling, ecchymosis surrounding ears bilaterally. No mastoid tenderness bilaterally. No TMJ tenderness bilaterally.  Nose:Nasal congestion with clear rhinorrhea  Mouth/Throat: Mucous membranes are moist. No pharyngeal erythema. No tonsillar swelling or exudate.  Neck: No stridor.  No cervical spine tenderness to palpation. Hematological/Lymphatic/Immunilogical: No cervical lymphadenopathy. Cardiovascular: Normal rate, regular rhythm. Grossly normal heart sounds.  Good peripheral circulation. Respiratory: Normal respiratory effort.  No retractions. No wheezes, rales or rhonchi. Good air movement.  Gastrointestinal: Soft and nontender. No CVA tenderness. Musculoskeletal: Ambulatory with steady gait. No cervical, thoracic or lumbar tenderness to palpation. Neurologic:  Normal speech and language. No gait instability. Skin:  Skin appears warm, dry and intact. No rash noted. Psychiatric: Mood and affect are normal. Speech and behavior are normal. ___________________________________________   LABS (all labs ordered are listed,  but only abnormal results are displayed)  Labs Reviewed - No data to display ____________________________________________  PROCEDURES Procedures     INITIAL IMPRESSION / ASSESSMENT AND PLAN / ED COURSE  Pertinent labs & imaging results that were available during my care of the patient were reviewed by me and considered in my medical decision making (see chart for details).  Well-appearing patient. No acute distress. Chronic seasonal allergy history with some rhinorrhea. Complaining of right ear discomfort intermittently over the last several days. Cerumen removed with curette to right ear. Right ear otitis noted. Patient [redacted] weeks pregnant. Will treat patient with oral amoxicillin. Encouraged patient to follow-up this week with her primary care physician. Patient states he is at a point with her OB/GYN this next week as well. Discussed strict follow-up and return parameters with patient including fever, increased pain, redness, swelling or other complaints.  Discussed follow up with Primary care physician this week. Discussed follow up and return parameters including no resolution or any worsening concerns. Patient verbalized understanding and agreed to plan.   ____________________________________________   FINAL CLINICAL IMPRESSION(S) / ED DIAGNOSES  Final diagnoses:  Right otitis media with effusion  Nasal congestion  Excessive cerumen in ear canal, right     Discharge Medication List as of 08/23/2016  8:48 AM    START  taking these medications   Details  amoxicillin (AMOXIL) 875 MG tablet Take 1 tablet (875 mg total) by mouth 2 (two) times daily., Starting Fri 08/23/2016, Normal        Note: This dictation was prepared with Dragon dictation along with smaller phrase technology. Any transcriptional errors that result from this process are unintentional.         Renford Dills, NP 08/23/16 9211    Renford Dills, NP 08/23/16 [phone]

## 2016-08-23 NOTE — ED Triage Notes (Signed)
Patient c/o pain and pressure behind her right ear for three days.  Patient denies fevers.

## 2016-08-23 NOTE — Discharge Instructions (Signed)
Take medication as prescribed. Rest. Drink plenty of fluids.  ° °Follow up with your primary care physician this week. Return to Urgent care for new or worsening concerns.  ° °

## 2016-09-05 LAB — OB RESULTS CONSOLE HGB/HCT, BLOOD
HEMATOCRIT: 34 %
HEMOGLOBIN: 11.6 g/dL

## 2016-09-05 LAB — OB RESULTS CONSOLE GC/CHLAMYDIA
Chlamydia: NEGATIVE
Gonorrhea: NEGATIVE

## 2016-09-05 LAB — OB RESULTS CONSOLE VARICELLA ZOSTER ANTIBODY, IGG: VARICELLA IGG: IMMUNE

## 2016-09-05 LAB — SICKLE CELL SCREEN: Sickle Cell Screen: NORMAL

## 2016-09-05 LAB — OB RESULTS CONSOLE RPR: RPR: NONREACTIVE

## 2016-09-05 LAB — OB RESULTS CONSOLE HEPATITIS B SURFACE ANTIGEN: Hepatitis B Surface Ag: NEGATIVE

## 2016-09-05 LAB — OB RESULTS CONSOLE HIV ANTIBODY (ROUTINE TESTING): HIV: NONREACTIVE

## 2016-09-05 LAB — OB RESULTS CONSOLE PLATELET COUNT: Platelets: 372 10*3/uL

## 2016-09-05 LAB — OB RESULTS CONSOLE RUBELLA ANTIBODY, IGM: RUBELLA: IMMUNE

## 2016-09-17 ENCOUNTER — Other Ambulatory Visit: Payer: Self-pay | Admitting: Obstetrics & Gynecology

## 2016-09-17 DIAGNOSIS — Z369 Encounter for antenatal screening, unspecified: Secondary | ICD-10-CM

## 2016-09-24 ENCOUNTER — Telehealth: Payer: Self-pay

## 2016-09-24 NOTE — Telephone Encounter (Signed)
Pt aware per PH to let primary care know 1000 U per day.

## 2016-09-24 NOTE — Telephone Encounter (Signed)
Pt's primary, [facility], states vitamin D level is 9.  They want to know if it's okay for them to rx Vitamin D and if so, how much for twins?

## 2016-09-24 NOTE — Telephone Encounter (Signed)
OK to take 1000 U per day supplementation.

## 2016-09-30 DIAGNOSIS — F429 Obsessive-compulsive disorder, unspecified: Secondary | ICD-10-CM | POA: Insufficient documentation

## 2016-10-03 ENCOUNTER — Other Ambulatory Visit: Payer: Self-pay | Admitting: *Deleted

## 2016-10-03 DIAGNOSIS — O30009 Twin pregnancy, unspecified number of placenta and unspecified number of amniotic sacs, unspecified trimester: Secondary | ICD-10-CM

## 2016-10-03 DIAGNOSIS — Z369 Encounter for antenatal screening, unspecified: Secondary | ICD-10-CM

## 2016-10-07 ENCOUNTER — Ambulatory Visit (HOSPITAL_BASED_OUTPATIENT_CLINIC_OR_DEPARTMENT_OTHER)
Admission: RE | Admit: 2016-10-07 | Discharge: 2016-10-07 | Disposition: A | Discharge status: Home / Self Care | Payer: Medicaid Other | Source: Ambulatory Visit | Attending: Obstetrics and Gynecology | Admitting: Obstetrics and Gynecology

## 2016-10-07 ENCOUNTER — Ambulatory Visit
Admission: RE | Admit: 2016-10-07 | Discharge: 2016-10-07 | Disposition: A | Discharge status: Home / Self Care | Payer: Medicaid Other | Source: Ambulatory Visit | Attending: Obstetrics & Gynecology | Admitting: Obstetrics & Gynecology

## 2016-10-07 ENCOUNTER — Other Ambulatory Visit: Payer: Self-pay | Admitting: *Deleted

## 2016-10-07 VITALS — BP 122/75 | HR 78 | Temp 98.2°F | Resp 16 | Ht 60.0 in | Wt 156.0 lb

## 2016-10-07 DIAGNOSIS — Z369 Encounter for antenatal screening, unspecified: Secondary | ICD-10-CM

## 2016-10-07 DIAGNOSIS — F1010 Alcohol abuse, uncomplicated: Secondary | ICD-10-CM | POA: Diagnosis not present

## 2016-10-07 DIAGNOSIS — Z3A13 13 weeks gestation of pregnancy: Secondary | ICD-10-CM | POA: Diagnosis not present

## 2016-10-07 DIAGNOSIS — Z3689 Encounter for other specified antenatal screening: Secondary | ICD-10-CM | POA: Diagnosis present

## 2016-10-07 DIAGNOSIS — O30031 Twin pregnancy, monochorionic/diamniotic, first trimester: Secondary | ICD-10-CM | POA: Diagnosis not present

## 2016-10-07 DIAGNOSIS — O09522 Supervision of elderly multigravida, second trimester: Secondary | ICD-10-CM | POA: Insufficient documentation

## 2016-10-07 DIAGNOSIS — O30032 Twin pregnancy, monochorionic/diamniotic, second trimester: Secondary | ICD-10-CM

## 2016-10-07 DIAGNOSIS — O30009 Twin pregnancy, unspecified number of placenta and unspecified number of amniotic sacs, unspecified trimester: Secondary | ICD-10-CM

## 2016-10-07 DIAGNOSIS — O09521 Supervision of elderly multigravida, first trimester: Secondary | ICD-10-CM | POA: Diagnosis not present

## 2016-10-07 DIAGNOSIS — O3090 Multiple gestation, unspecified, unspecified trimester: Secondary | ICD-10-CM | POA: Insufficient documentation

## 2016-10-07 NOTE — Progress Notes (Signed)
Lauren Wells, MS, CGC performed an integral service incident to the physician's initial service.  I was physically present in the clinical area and was immediately available to render assistance.   Stefanos Haynesworth C Lakyla Biswas  

## 2016-10-07 NOTE — Progress Notes (Signed)
Referring Provider:  Domingo Pulse Length of Consultation: 45 minutes  Lauren Tapia was referred to [facility] [place] for genetic counseling because of maternal age.  The patient is pregnant with twins and will be 31 years old at the time of delivery. This note summarizes the information we discussed.    We explained that the chance of a chromosome abnormality increases with maternal age.  Chromosomes and examples of chromosome problems were reviewed.  Humans typically have 46 chromosomes in each cell, with half passed through each sperm and egg.  Any change in the number or structure of chromosomes can increase the risk of problems in the physical and mental development of a pregnancy.   Based upon age of the patient, the chance of any chromosome abnormality in one or both babies is estimated to be 1 in 42.  The chance of Down syndrome, the most common chromosome problem associated with maternal age, is 1 in 76 for one or both babies.  The risk of chromosome problems is in addition to the 3% general population risk for birth defects and mental retardation.  The greatest chance, of course, is that both babies would be born in good health.  We discussed the following prenatal screening and testing options for this pregnancy:  First trimester screening, which can include nuchal translucency ultrasound screen and/or first trimester maternal serum marker screening.  The nuchal translucency has approximately an 70% detection rate for Down syndrome and can be positive for other chromosome abnormalities as well as heart defects.  When combined with a maternal serum marker screening in a twin gestation, the detection rate is up to 80% for Down syndrome and up to 90% for trisomy 18 and 13.     The chorionic villus sampling procedure is available for first trimester chromosome analysis.  This involves the withdrawal of a small amount of chorionic villi (tissue from the developing  placenta).  Risk of pregnancy loss is estimated to be approximately 1 in 100 to 1 in 50 (1% to 2%).  There is approximately a 1% (1 in 100) chance that the CVS chromosome results will be unclear.  Chorionic villi cannot be tested for neural tube defects.     Maternal serum marker screening, a blood test that measures pregnancy proteins, can provide risk assessments for Down syndrome and open neural tube defects (spina bifida, anencephaly). Because it does not directly examine the fetus, it cannot positively diagnose or rule out these problems.  Targeted ultrasound uses high frequency sound waves to create an image of the developing fetus.  An ultrasound is often recommended as a routine means of evaluating the pregnancy.  It is also used to screen for fetal anatomy problems (for example, a heart defect) that might be suggestive of a chromosomal or other abnormality.   Amniocentesis involves the removal of a small amount of amniotic fluid from the sac surrounding the fetus with the use of a thin needle inserted through the maternal abdomen and uterus.  Ultrasound guidance is used throughout the procedure.  Fetal cells from amniotic fluid are directly evaluated and > 99.5% of chromosome problems and > 98% of open neural tube defects can be detected. This procedure is generally performed after the 15th week of pregnancy.  The main risks to this procedure include complications leading to miscarriage is up to 1 in 100 (1%) in a twin gestation.  We also reviewed the availability of cell free fetal DNA testing from maternal blood to determine whether  or not the baby may have either Down syndrome, trisomy 51, or trisomy 48.  This test utilizes a maternal blood sample and DNA sequencing technology to isolate circulating cell free fetal DNA from maternal plasma.  The fetal DNA can then be analyzed for DNA sequences that are derived from the three most common chromosomes involved in aneuploidy, chromosomes 13, 18, and  21.  If the overall amount of DNA is greater than the expected level for any of these chromosomes, aneuploidy is suspected.  While we do not consider it a replacement for invasive testing and karyotype analysis, a negative result from this testing would be reassuring, though not a guarantee of a normal chromosome complement for the baby.  An abnormal result is certainly suggestive of an abnormal chromosome complement, though we would still recommend CVS or amniocentesis to confirm any findings from this testing.  While there is limited data regarding detection rates in twin gestations, the lab feel that testing can be used assess for these fetal aneuploidies.    Cystic Fibrosis and Spinal Muscular Atrophy (SMA) screening were also discussed with the patient. Both conditions are recessive, which means that both parents must be carriers in order to have a child with the disease.  Cystic fibrosis (CF) is one of the most common genetic conditions in persons of Caucasian ancestry.  This condition occurs in approximately 1 in 2,500 Caucasian persons and results in thickened secretions in the lungs, digestive, and reproductive systems.  For a baby to be at risk for having CF, both of the parents must be carriers for this condition.  Approximately 1 in 70 Caucasian persons is a carrier for CF.  Current carrier testing looks for the most common mutations in the gene for CF and can detect approximately 90% of carriers in the Caucasian population.  This means that the carrier screening can greatly reduce, but cannot eliminate, the chance for an individual to have a child with CF.  If an individual is found to be a carrier for CF, then carrier testing would be available for the partner. As part of [place]'s newborn screening profile, all babies born in the state of [place] will have a two-tier screening process.  Specimens are first tested to determine the concentration of immunoreactive trypsinogen (IRT).  The  top 5% of specimens with the highest IRT values then undergo DNA testing using a panel of over 40 common CF mutations. SMA is a neurodegenerative disorder that leads to atrophy of skeletal muscle and overall weakness.  This condition is also more prevalent in the Caucasian population, with 1 in 40-1 in 60 persons being a carrier and 1 in 6,000-1 in 10,000 children being affected.  There are multiple forms of the disease, with some causing death in infancy to other forms with survival into adulthood.  The genetics of SMA is complex, but carrier screening can detect up to 95% of carriers in the Caucasian population.  Similar to CF, a negative result can greatly reduce, but cannot eliminate, the chance to have a child with SMA.  Lauren Tapia has already had screening in this pregnancy for hemoglobinopathies which showed normal adult hemoglobin and a normal MCV.    We obtained a detailed family history and pregnancy history.  This is the fourth pregnancy for Lauren Tapia.  She has two children from prior relationships, a 3 year old son and 90 year old daughter, who are both in good health.  She and her current partner share a 5  month old daughter who is healthy, and this current pregnancy.  In the family history, the patient reported one maternal half brother with seizures and learning delays.  This is reported to be the result of maternal substance abuse during her pregnancy with him.  He has no birth differences and no other known health concerns.  She also has a paternal aunt with seizures.  We reviewed that there may be many different causes for seizures, including illness, trauma and other health conditions as well as certain genetic syndromes.  Without any additional information, it is difficult to determine the chance for this pregnancy to have a similar condition. Lauren Tapia also reported one maternal first cousin with sickle cell disease.  Because she already had testing and as found not to be a carrier for  sickle cell, then we do not expect this pregnancy to be at risk for sickle cell disease.  The father of the baby, Adela Lank, reported that his maternal half sister's daughter was born with disabilities including being unable to walk or talk, possibly the result of in utero substance use.  Adela Lank also has a paternal first cousin who is described as having a similar condition.  Without more specific medical information about the cause for these conditions, we cannot accurately determine the chance for other family members to have a similar condition.  If their differences are the result of prenatal substance use, then we would not expect other relatives to be at risk provided they did not have similar exposures.  The remainder of the family history is unremarkable for birth defects, developmental delays, recurrent pregnancy loss or known chromosome abnormalities.   Lauren Tapia denied any other pregnancy complications thus far.  She reported smoking daily, and has cut back to 1/2 pack per week.  As we discussed, smoking during pregnancy has been associated with low birth weight, premature delivery and pregnancy loss.  For this reason, we suggest that she cut back or avoid smoking for the remainder of the pregnancy.  Lauren Tapia also reported drinking alcohol.  Prior to pregnancy, she was drinking a bottle of wine per day.  She indicated that she has cut back since learning that she is pregnant, but that she continues to crave alcohol and drink most days, approximately 1-2 drinks. We discussed the effect alcohol has on pregnancy and that there is no safe amount of alcohol during pregnancy. Some of the prenatal concerns are miscarriage, stillbirth, and preterm labor. Some of the postnatal concerns are low birth weight and birth defects including the heart and facial differences. Additionally, there is the risk for fetal alcohol spectrum disorders (FASD) which are disorders that affect brain function as well as physical  differences. Some of the features of FASD are attention deficient disorder, learning disability and difficulty with communication. There is the potential for behavioral and learning concerns even without the diagnosis of FASD. We discussed with Lauren Tapia these concerns and if she would like contact information for a counselor to further assist with alcohol dependence. Lauren Tapia let us know she does have a counselor and has been working on this issue. We also discussed the recommendation of a detailed anatomy ultrasound after [redacted] weeks gestation and fetal echocardiogram at [redacted] weeks gestation.    After consideration of the options, the patient elected to proceed with cell free DNA testing as well as CF and SMA carrier screening today.  Results should be available in 1-2 weeks.  An ultrasound was performed at the time of  the visit.  The gestational age was consistent with 13 weeks 2 days.  Fetal anatomy could not be assessed due to early gestational age. See ultrasound report for details and for recommendations for follow up given the fact that this pregnancy is monochorionic.  Thank you for involving [place] in the care of this patient.  Lauren Tapia was encouraged to call with questions or concerns.  We can be contacted at [phone].   Cherly Anderson, MS, CGC

## 2016-10-14 ENCOUNTER — Ambulatory Visit (INDEPENDENT_AMBULATORY_CARE_PROVIDER_SITE_OTHER): Payer: Medicaid Other | Admitting: Obstetrics & Gynecology

## 2016-10-14 ENCOUNTER — Telehealth: Payer: Self-pay | Admitting: Obstetrics and Gynecology

## 2016-10-14 VITALS — BP 100/60 | Wt 158.0 lb

## 2016-10-14 DIAGNOSIS — O0992 Supervision of high risk pregnancy, unspecified, second trimester: Secondary | ICD-10-CM | POA: Insufficient documentation

## 2016-10-14 DIAGNOSIS — Z3490 Encounter for supervision of normal pregnancy, unspecified, unspecified trimester: Secondary | ICD-10-CM

## 2016-10-14 DIAGNOSIS — Z3A14 14 weeks gestation of pregnancy: Secondary | ICD-10-CM

## 2016-10-14 DIAGNOSIS — O30031 Twin pregnancy, monochorionic/diamniotic, first trimester: Secondary | ICD-10-CM

## 2016-10-14 NOTE — Progress Notes (Signed)
Twins, RF discussed.  cfDNA done, normal.    Plans ant scan at [facility].    f/u growth scans at [facility].  PTL precautions discussed.  TTTS precautions if mono also discussed. Plans BTL.  No complaints today.

## 2016-10-14 NOTE — Telephone Encounter (Signed)
The patient was informed of the results of her recent InformaSeq testing (performed at [facility]) which yielded NEGATIVE results in this twin gestation.  The patient's specimen showed DNA consistent with two copies of chromosomes 21, 18 and 13.  The sensitivity for trisomy 44, trisomy 50 and trisomy 81 using this testing are reported as 99.1%, 98.3% and 98.1% respectively.  Thus, while the results of this testing are highly accurate, they are not considered diagnostic at this time.  Should more definitive information be desired, the patient may still consider amniocentesis.   As requested by the patient, analysis for Y chromosome DNA was included for this sample.  Results were consistent with at least one female fetus due to the presence of Y chromosome DNA.  This testing in a twin gestation cannot determine if one or both babies may be female. Y chromosome material is detected with >97% accuracy.  A maternal serum AFP only should be considered if screening for neural tube defects is desired.  Lauren Anderson, MS, CGC

## 2016-10-21 ENCOUNTER — Telehealth: Payer: Self-pay | Admitting: Obstetrics and Gynecology

## 2016-10-21 ENCOUNTER — Other Ambulatory Visit: Payer: Self-pay | Admitting: *Deleted

## 2016-10-21 DIAGNOSIS — IMO0001 Reserved for inherently not codable concepts without codable children: Secondary | ICD-10-CM

## 2016-10-21 NOTE — Telephone Encounter (Signed)
Lauren Tapia elected to have carrier screening for CF and SMA at her genetic counseling visit on 10/07/16.  Those results are now available.  We left a voicemail for the patient to contact our office to discuss these results.  Cystic Fibrosis screening was also discussed with the patient. Cystic fibrosis (CF) is one of the most common genetic conditions in persons of Caucasian ancestry.  This condition occurs in approximately 1 in 2,500 Caucasian persons and results in thickened secretions in the lungs, digestive, and reproductive systems.  It is less common in individuals of other ancestries.  For a baby to be at risk for having CF, both of the parents must be carriers for this condition.  Approximately 1 in 18 persons of African American ancestry is a carrier for CF.  Current carrier testing looks for the most common mutations in the gene for CF and can detect approximately 69% of carriers in the African American population.  This means that the carrier screening can reduce, but cannot eliminate, the chance for an individual to have a child with CF.  If an individual is found to be a carrier for CF, then carrier testing would be available for the partner. As part of Lauren Tapia's newborn screening profile, all babies born in the state of [place] will have a two-tier screening process.  Specimens are first tested to determine the concentration of immunoreactive trypsinogen (IRT).  The top 5% of specimens with the highest IRT values then undergo DNA testing using a panel of over 40 common CF mutations. Lauren Tapia CF results were negative for the 32 mutations evaluated by this test.  This reduces the chance that she is a carrier from 1 in 56 to 1 in 207.  The results of the SMA carrier screening are also available.  SMA is also a recessive genetic condition with variable age of onset and severity caused by mutations in the SMN1 gene.  This carrier testing assesses the number of copies of this gene.   Persons with one copy of the SMN1 gene are carriers, and those with no copies are affected with the condition.  Individuals with two or more copies have a reduced chance to be a carrier.  Not all mutations can be detected with this testing, though it can detect 94.8% of carriers in the Caucasian population and 70.5% in the African American population.  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  Again, this testing cannot eliminate the chance to have a child with SMA, but dramatically reduces the chance.    We encouraged the patient to call with any questions or concerns as they arise.  We may be reached at [phone].  Cherly Anderson, MS, CGC

## 2016-10-24 ENCOUNTER — Ambulatory Visit
Admission: RE | Admit: 2016-10-24 | Discharge: 2016-10-24 | Disposition: A | Discharge status: Home / Self Care | Payer: Medicaid Other | Source: Ambulatory Visit | Attending: Maternal & Fetal Medicine | Admitting: Maternal & Fetal Medicine

## 2016-10-24 DIAGNOSIS — IMO0001 Reserved for inherently not codable concepts without codable children: Secondary | ICD-10-CM

## 2016-10-24 DIAGNOSIS — Z3689 Encounter for other specified antenatal screening: Secondary | ICD-10-CM | POA: Insufficient documentation

## 2016-10-24 DIAGNOSIS — O30042 Twin pregnancy, dichorionic/diamniotic, second trimester: Secondary | ICD-10-CM | POA: Insufficient documentation

## 2016-10-24 DIAGNOSIS — Z3A15 15 weeks gestation of pregnancy: Secondary | ICD-10-CM | POA: Diagnosis not present

## 2016-10-24 DIAGNOSIS — O30031 Twin pregnancy, monochorionic/diamniotic, first trimester: Secondary | ICD-10-CM

## 2016-11-04 ENCOUNTER — Ambulatory Visit (INDEPENDENT_AMBULATORY_CARE_PROVIDER_SITE_OTHER): Payer: Medicaid Other | Admitting: Obstetrics and Gynecology

## 2016-11-04 VITALS — BP 118/74 | Wt 162.0 lb

## 2016-11-04 DIAGNOSIS — O09522 Supervision of elderly multigravida, second trimester: Secondary | ICD-10-CM

## 2016-11-04 DIAGNOSIS — Z3A17 17 weeks gestation of pregnancy: Secondary | ICD-10-CM

## 2016-11-04 DIAGNOSIS — E669 Obesity, unspecified: Secondary | ICD-10-CM

## 2016-11-04 DIAGNOSIS — Z131 Encounter for screening for diabetes mellitus: Secondary | ICD-10-CM

## 2016-11-04 DIAGNOSIS — F411 Generalized anxiety disorder: Secondary | ICD-10-CM

## 2016-11-04 DIAGNOSIS — Z720 Tobacco use: Secondary | ICD-10-CM

## 2016-11-04 DIAGNOSIS — O0992 Supervision of high risk pregnancy, unspecified, second trimester: Secondary | ICD-10-CM

## 2016-11-04 DIAGNOSIS — O30032 Twin pregnancy, monochorionic/diamniotic, second trimester: Secondary | ICD-10-CM

## 2016-11-04 NOTE — Progress Notes (Signed)
BSUS: twin gestation, FHR A 144, FHR B 150, +FM x 2.  ANatomy scan at [facility] PN 1 week.  Early 1h gtt when possible given BMI >30.

## 2016-11-05 LAB — INFORMASEQ(SM) WITH XY ANALYSIS
FETAL FRACTION (%): 22.5
FETAL NUMBER: 2
Gestational Age at Collection: 12.3 weeks
SEX CHROMOSOME RESULT: DETECTED
Weight: 156 [lb_av]

## 2016-11-05 LAB — CYSTIC FIBROSIS GENE TEST

## 2016-11-07 ENCOUNTER — Other Ambulatory Visit: Payer: Self-pay | Admitting: *Deleted

## 2016-11-07 DIAGNOSIS — IMO0001 Reserved for inherently not codable concepts without codable children: Secondary | ICD-10-CM

## 2016-11-11 ENCOUNTER — Ambulatory Visit
Admission: RE | Admit: 2016-11-11 | Discharge: 2016-11-11 | Disposition: A | Discharge status: Home / Self Care | Payer: Medicaid Other | Source: Ambulatory Visit | Attending: Obstetrics and Gynecology | Admitting: Obstetrics and Gynecology

## 2016-11-11 ENCOUNTER — Other Ambulatory Visit: Payer: Self-pay | Admitting: Obstetrics and Gynecology

## 2016-11-11 ENCOUNTER — Telehealth: Payer: Self-pay | Admitting: *Deleted

## 2016-11-11 DIAGNOSIS — F1020 Alcohol dependence, uncomplicated: Secondary | ICD-10-CM | POA: Insufficient documentation

## 2016-11-11 DIAGNOSIS — O30032 Twin pregnancy, monochorionic/diamniotic, second trimester: Secondary | ICD-10-CM

## 2016-11-11 DIAGNOSIS — Z3689 Encounter for other specified antenatal screening: Secondary | ICD-10-CM

## 2016-11-11 DIAGNOSIS — O99312 Alcohol use complicating pregnancy, second trimester: Secondary | ICD-10-CM | POA: Diagnosis not present

## 2016-11-11 DIAGNOSIS — O09892 Supervision of other high risk pregnancies, second trimester: Secondary | ICD-10-CM | POA: Insufficient documentation

## 2016-11-11 DIAGNOSIS — O09522 Supervision of elderly multigravida, second trimester: Secondary | ICD-10-CM

## 2016-11-11 DIAGNOSIS — Z3A18 18 weeks gestation of pregnancy: Secondary | ICD-10-CM | POA: Insufficient documentation

## 2016-11-11 DIAGNOSIS — O0992 Supervision of high risk pregnancy, unspecified, second trimester: Secondary | ICD-10-CM

## 2016-11-11 DIAGNOSIS — IMO0001 Reserved for inherently not codable concepts without codable children: Secondary | ICD-10-CM

## 2016-11-11 LAB — SMN1 COPY NUMBER ANALYSIS (SMA CARRIER SCREENING)

## 2016-11-11 NOTE — Telephone Encounter (Signed)
Made appointment with Brandie at [facility] for May 18th at 1030.  Directions and time given to patient.

## 2016-11-19 ENCOUNTER — Other Ambulatory Visit: Payer: Medicaid Other

## 2016-11-19 ENCOUNTER — Ambulatory Visit (INDEPENDENT_AMBULATORY_CARE_PROVIDER_SITE_OTHER): Payer: Medicaid Other | Admitting: Advanced Practice Midwife

## 2016-11-19 VITALS — BP 116/74 | Wt 162.0 lb

## 2016-11-19 DIAGNOSIS — E669 Obesity, unspecified: Secondary | ICD-10-CM

## 2016-11-19 DIAGNOSIS — O30032 Twin pregnancy, monochorionic/diamniotic, second trimester: Secondary | ICD-10-CM

## 2016-11-19 DIAGNOSIS — Z131 Encounter for screening for diabetes mellitus: Secondary | ICD-10-CM

## 2016-11-19 DIAGNOSIS — Z3A19 19 weeks gestation of pregnancy: Secondary | ICD-10-CM

## 2016-11-19 NOTE — Progress Notes (Signed)
Early 1 hr gtt today. Pt c/o some pressure in lower back and stomach.

## 2016-11-19 NOTE — Progress Notes (Signed)
Here for 1 hr gtt. c/o sciatica pain which she had with her last pregnancy but experiencing it earlier with this pregnancy. Reviewed comfort measures and encouraged not to take ibuprofen. She is wondering about steroid injection. Will consult with MD and let her know. +FMx2. Fetus A on maternal R: HR 150, Fetus B on maternal L: HR 159.

## 2016-11-20 LAB — GLUCOSE, 1 HOUR GESTATIONAL: Gestational Diabetes Screen: 98 mg/dL (ref 65–139)

## 2016-11-25 ENCOUNTER — Other Ambulatory Visit: Payer: Self-pay | Admitting: *Deleted

## 2016-11-25 DIAGNOSIS — O30009 Twin pregnancy, unspecified number of placenta and unspecified number of amniotic sacs, unspecified trimester: Secondary | ICD-10-CM

## 2016-11-28 ENCOUNTER — Other Ambulatory Visit: Payer: Self-pay | Admitting: Maternal and Fetal Medicine

## 2016-11-28 ENCOUNTER — Ambulatory Visit
Admission: RE | Admit: 2016-11-28 | Discharge: 2016-11-28 | Disposition: A | Discharge status: Home / Self Care | Payer: Medicaid Other | Source: Ambulatory Visit | Attending: Maternal & Fetal Medicine | Admitting: Maternal & Fetal Medicine

## 2016-11-28 ENCOUNTER — Other Ambulatory Visit: Payer: Self-pay | Admitting: *Deleted

## 2016-11-28 DIAGNOSIS — O30032 Twin pregnancy, monochorionic/diamniotic, second trimester: Secondary | ICD-10-CM

## 2016-11-28 DIAGNOSIS — O30009 Twin pregnancy, unspecified number of placenta and unspecified number of amniotic sacs, unspecified trimester: Secondary | ICD-10-CM | POA: Diagnosis present

## 2016-11-28 DIAGNOSIS — Z3A20 20 weeks gestation of pregnancy: Secondary | ICD-10-CM | POA: Diagnosis not present

## 2016-11-28 DIAGNOSIS — O0992 Supervision of high risk pregnancy, unspecified, second trimester: Secondary | ICD-10-CM

## 2016-11-28 DIAGNOSIS — O09522 Supervision of elderly multigravida, second trimester: Secondary | ICD-10-CM

## 2016-12-03 ENCOUNTER — Ambulatory Visit (INDEPENDENT_AMBULATORY_CARE_PROVIDER_SITE_OTHER): Payer: Medicaid Other | Admitting: Obstetrics and Gynecology

## 2016-12-03 VITALS — BP 102/66 | Wt 164.0 lb

## 2016-12-03 DIAGNOSIS — Z3A21 21 weeks gestation of pregnancy: Secondary | ICD-10-CM

## 2016-12-03 DIAGNOSIS — O9989 Other specified diseases and conditions complicating pregnancy, childbirth and the puerperium: Principal | ICD-10-CM

## 2016-12-03 DIAGNOSIS — M549 Dorsalgia, unspecified: Secondary | ICD-10-CM | POA: Diagnosis not present

## 2016-12-03 DIAGNOSIS — O26899 Other specified pregnancy related conditions, unspecified trimester: Secondary | ICD-10-CM

## 2016-12-03 DIAGNOSIS — M25559 Pain in unspecified hip: Secondary | ICD-10-CM

## 2016-12-03 DIAGNOSIS — O30032 Twin pregnancy, monochorionic/diamniotic, second trimester: Secondary | ICD-10-CM

## 2016-12-03 LAB — POCT URINALYSIS DIPSTICK
BILIRUBIN UA: NEGATIVE
Blood, UA: NEGATIVE
Glucose, UA: NEGATIVE
KETONES UA: NEGATIVE
Leukocytes, UA: NEGATIVE
NITRITE UA: NEGATIVE
PH UA: 6.5 (ref 5.0–8.0)
Spec Grav, UA: 1.015 (ref 1.010–1.025)
Urobilinogen, UA: NEGATIVE E.U./dL — AB

## 2016-12-03 NOTE — Progress Notes (Signed)
Back/abdominal pain

## 2016-12-03 NOTE — Progress Notes (Signed)
    Routine Prenatal Care Visit  Subjective  Fetal Movement? yes Contractions? no Leaking Fluid? no Vaginal Bleeding? 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  Previously received cortisone injection outside of pregnancy at [place]  Objective   Vitals:   12/03/16 1332  BP: 102/66    @WEIGHTCHANGE @ Urine dipstick shows negative for all components.  General: NAD Pumonary: no increased work of breathing Abdomen: gravid, non-tender, ffetal heart tones 140 and 135BPM Extremities: no edema Psychiatric: mood appropriate, affect full   Assessment   31 y.o. 32 at [redacted]w[redacted]d by  04/12/2017, by Ultrasound presenting for routine prenatal visit  fourth pregnancy Problems (from 09/30/16 to present)    Problem Noted Resolved   Pregnancy, supervision, high-risk, second trimester 10/14/2016 by 10/16/2016, MD No   Overview Signed 10/14/2016 10:39 AM by 10/16/2016, MD    Clinic [facility]  Dating Nadara Mustard  Genetic Screen 1 Screen:    AFP:     Quad:     NIPS:  Anatomic [place]   GTT Early:               Third trimester:   Rhogam   TDaP vaccine                       Flu Shot:  Baby Food                                 Contraception BTL  CBB    CS/VBAC   Support Person   TWINS Growth [place] 28 weeks.          Prenatal Labs  Blood type: --/--/O POS (10/14 1847)   Antibody:NEG (10/14 1847)  Rubella: Immune (02/08 0000) Varicella: I  RPR: Nonreactive (02/08 0000)   HBsAg: Negative (02/08 0000)   HIV: Non-reactive (02/08 0000)   GBS:   Pap:               Multiple gestation 10/07/2016 by 12/07/2016, MD No   Overview Signed 10/07/2016 11:44 AM by 12/07/2016, MD    Monochorionic diamniotic Needs TTTS check every 2 weeks and growth every 4 weeks Offer fetal echocardiogram at about 22 weeks      Advanced maternal age in multigravida, second trimester  by Kirby Funk No       Plan   Problem List Items Addressed This Visit        Other   Multiple gestation    Other Visit Diagnoses    Back pain affecting pregnancy, antepartum    -  Primary   Relevant Orders   POCT urinalysis dipstick (Completed)   [redacted] weeks gestation of pregnancy         - UA negative - Given history of degenerative changes prior improvement with cortisone injection will refer to orthopedics

## 2016-12-09 ENCOUNTER — Other Ambulatory Visit: Payer: Self-pay | Admitting: *Deleted

## 2016-12-09 DIAGNOSIS — O30009 Twin pregnancy, unspecified number of placenta and unspecified number of amniotic sacs, unspecified trimester: Secondary | ICD-10-CM

## 2016-12-12 ENCOUNTER — Other Ambulatory Visit: Payer: Self-pay | Admitting: Maternal and Fetal Medicine

## 2016-12-12 ENCOUNTER — Ambulatory Visit
Admission: RE | Admit: 2016-12-12 | Discharge: 2016-12-12 | Disposition: A | Discharge status: Home / Self Care | Payer: Medicaid Other | Source: Ambulatory Visit | Attending: Maternal & Fetal Medicine | Admitting: Maternal & Fetal Medicine

## 2016-12-12 DIAGNOSIS — Z3A22 22 weeks gestation of pregnancy: Secondary | ICD-10-CM | POA: Insufficient documentation

## 2016-12-12 DIAGNOSIS — O0992 Supervision of high risk pregnancy, unspecified, second trimester: Secondary | ICD-10-CM

## 2016-12-12 DIAGNOSIS — O09522 Supervision of elderly multigravida, second trimester: Secondary | ICD-10-CM

## 2016-12-12 DIAGNOSIS — O30009 Twin pregnancy, unspecified number of placenta and unspecified number of amniotic sacs, unspecified trimester: Secondary | ICD-10-CM

## 2016-12-12 DIAGNOSIS — O30032 Twin pregnancy, monochorionic/diamniotic, second trimester: Secondary | ICD-10-CM

## 2016-12-16 DIAGNOSIS — F32A Depression, unspecified: Secondary | ICD-10-CM | POA: Insufficient documentation

## 2016-12-16 DIAGNOSIS — D75A Glucose-6-phosphate dehydrogenase (G6PD) deficiency without anemia: Secondary | ICD-10-CM | POA: Insufficient documentation

## 2016-12-16 DIAGNOSIS — E559 Vitamin D deficiency, unspecified: Secondary | ICD-10-CM | POA: Insufficient documentation

## 2016-12-16 DIAGNOSIS — F329 Major depressive disorder, single episode, unspecified: Secondary | ICD-10-CM | POA: Insufficient documentation

## 2016-12-19 ENCOUNTER — Other Ambulatory Visit: Payer: Self-pay | Admitting: *Deleted

## 2016-12-19 DIAGNOSIS — O30009 Twin pregnancy, unspecified number of placenta and unspecified number of amniotic sacs, unspecified trimester: Secondary | ICD-10-CM

## 2016-12-24 ENCOUNTER — Ambulatory Visit (INDEPENDENT_AMBULATORY_CARE_PROVIDER_SITE_OTHER): Payer: Medicaid Other | Admitting: Advanced Practice Midwife

## 2016-12-24 VITALS — BP 110/60 | Wt 159.0 lb

## 2016-12-24 DIAGNOSIS — Z113 Encounter for screening for infections with a predominantly sexual mode of transmission: Secondary | ICD-10-CM

## 2016-12-24 DIAGNOSIS — Z131 Encounter for screening for diabetes mellitus: Secondary | ICD-10-CM

## 2016-12-24 DIAGNOSIS — Z3A24 24 weeks gestation of pregnancy: Secondary | ICD-10-CM

## 2016-12-24 DIAGNOSIS — Z130 Encounter for screening for diseases of the blood and blood-forming organs and certain disorders involving the immune mechanism: Secondary | ICD-10-CM

## 2016-12-24 NOTE — Progress Notes (Signed)
Feeling tired today and hasn't felt baby b as much this week. Twin A is on maternal right and FHTs are 16 Twin B is on maternal left and FHTs are 160 Following up with [facility] as planned Increasing vit D supplement to 2000 units daily due to limited increase in blood level 28 week labs at [facility]

## 2016-12-26 ENCOUNTER — Ambulatory Visit
Admission: RE | Admit: 2016-12-26 | Discharge: 2016-12-26 | Disposition: A | Discharge status: Home / Self Care | Payer: Medicaid Other | Source: Ambulatory Visit | Attending: Maternal and Fetal Medicine | Admitting: Maternal and Fetal Medicine

## 2016-12-26 ENCOUNTER — Other Ambulatory Visit: Payer: Self-pay | Admitting: *Deleted

## 2016-12-26 DIAGNOSIS — Z3A24 24 weeks gestation of pregnancy: Secondary | ICD-10-CM | POA: Insufficient documentation

## 2016-12-26 DIAGNOSIS — Q614 Renal dysplasia: Secondary | ICD-10-CM | POA: Insufficient documentation

## 2016-12-26 DIAGNOSIS — O30032 Twin pregnancy, monochorionic/diamniotic, second trimester: Secondary | ICD-10-CM | POA: Diagnosis not present

## 2016-12-26 DIAGNOSIS — O09522 Supervision of elderly multigravida, second trimester: Secondary | ICD-10-CM

## 2016-12-26 DIAGNOSIS — O30009 Twin pregnancy, unspecified number of placenta and unspecified number of amniotic sacs, unspecified trimester: Secondary | ICD-10-CM

## 2016-12-26 DIAGNOSIS — O321XX2 Maternal care for breech presentation, fetus 2: Secondary | ICD-10-CM | POA: Insufficient documentation

## 2016-12-26 DIAGNOSIS — O0992 Supervision of high risk pregnancy, unspecified, second trimester: Secondary | ICD-10-CM

## 2016-12-26 DIAGNOSIS — O321XX1 Maternal care for breech presentation, fetus 1: Secondary | ICD-10-CM | POA: Diagnosis not present

## 2016-12-26 DIAGNOSIS — O365990 Maternal care for other known or suspected poor fetal growth, unspecified trimester, not applicable or unspecified: Secondary | ICD-10-CM

## 2016-12-30 ENCOUNTER — Ambulatory Visit
Admission: RE | Admit: 2016-12-30 | Discharge: 2016-12-30 | Disposition: A | Discharge status: Home / Self Care | Payer: Medicaid Other | Source: Ambulatory Visit | Attending: Maternal & Fetal Medicine | Admitting: Maternal & Fetal Medicine

## 2016-12-30 ENCOUNTER — Other Ambulatory Visit: Payer: Self-pay | Admitting: *Deleted

## 2016-12-30 DIAGNOSIS — O358XX2 Maternal care for other (suspected) fetal abnormality and damage, fetus 2: Secondary | ICD-10-CM | POA: Insufficient documentation

## 2016-12-30 DIAGNOSIS — O30009 Twin pregnancy, unspecified number of placenta and unspecified number of amniotic sacs, unspecified trimester: Secondary | ICD-10-CM

## 2016-12-30 DIAGNOSIS — O0992 Supervision of high risk pregnancy, unspecified, second trimester: Secondary | ICD-10-CM

## 2016-12-30 DIAGNOSIS — O30032 Twin pregnancy, monochorionic/diamniotic, second trimester: Secondary | ICD-10-CM | POA: Insufficient documentation

## 2016-12-30 DIAGNOSIS — O09522 Supervision of elderly multigravida, second trimester: Secondary | ICD-10-CM

## 2016-12-30 DIAGNOSIS — O365990 Maternal care for other known or suspected poor fetal growth, unspecified trimester, not applicable or unspecified: Secondary | ICD-10-CM

## 2016-12-30 DIAGNOSIS — Z3A25 25 weeks gestation of pregnancy: Secondary | ICD-10-CM | POA: Diagnosis not present

## 2017-01-02 ENCOUNTER — Ambulatory Visit: Payer: Medicaid Other

## 2017-01-02 ENCOUNTER — Other Ambulatory Visit: Payer: Self-pay | Admitting: *Deleted

## 2017-01-02 DIAGNOSIS — O30002 Twin pregnancy, unspecified number of placenta and unspecified number of amniotic sacs, second trimester: Secondary | ICD-10-CM

## 2017-01-06 ENCOUNTER — Inpatient Hospital Stay: Admission: RE | Admit: 2017-01-06 | Payer: Medicaid Other | Source: Ambulatory Visit

## 2017-01-09 ENCOUNTER — Ambulatory Visit (HOSPITAL_COMMUNITY)
Admission: AD | Admit: 2017-01-09 | Discharge: 2017-01-09 | Disposition: A | Discharge status: Home / Self Care | Payer: Medicaid Other | Source: Other Acute Inpatient Hospital | Attending: Obstetrics and Gynecology | Admitting: Obstetrics and Gynecology

## 2017-01-09 ENCOUNTER — Observation Stay
Admission: RE | Admit: 2017-01-09 | Discharge: 2017-01-09 | Disposition: A | Discharge status: Other Acute Inpatient Hospital | Payer: Medicaid Other | Attending: Obstetrics and Gynecology | Admitting: Obstetrics and Gynecology

## 2017-01-09 ENCOUNTER — Other Ambulatory Visit: Payer: Self-pay | Admitting: *Deleted

## 2017-01-09 ENCOUNTER — Ambulatory Visit
Admission: RE | Admit: 2017-01-09 | Discharge: 2017-01-09 | Disposition: A | Discharge status: Home / Self Care | Payer: Medicaid Other | Source: Ambulatory Visit | Attending: Obstetrics and Gynecology | Admitting: Obstetrics and Gynecology

## 2017-01-09 ENCOUNTER — Other Ambulatory Visit: Payer: Self-pay | Admitting: Obstetrics and Gynecology

## 2017-01-09 DIAGNOSIS — O09892 Supervision of other high risk pregnancies, second trimester: Secondary | ICD-10-CM

## 2017-01-09 DIAGNOSIS — O30032 Twin pregnancy, monochorionic/diamniotic, second trimester: Secondary | ICD-10-CM | POA: Insufficient documentation

## 2017-01-09 DIAGNOSIS — J45909 Unspecified asthma, uncomplicated: Secondary | ICD-10-CM | POA: Diagnosis not present

## 2017-01-09 DIAGNOSIS — F4310 Post-traumatic stress disorder, unspecified: Secondary | ICD-10-CM | POA: Insufficient documentation

## 2017-01-09 DIAGNOSIS — F17210 Nicotine dependence, cigarettes, uncomplicated: Secondary | ICD-10-CM | POA: Diagnosis not present

## 2017-01-09 DIAGNOSIS — O99343 Other mental disorders complicating pregnancy, third trimester: Secondary | ICD-10-CM | POA: Insufficient documentation

## 2017-01-09 DIAGNOSIS — O99353 Diseases of the nervous system complicating pregnancy, third trimester: Secondary | ICD-10-CM | POA: Diagnosis not present

## 2017-01-09 DIAGNOSIS — O365922 Maternal care for other known or suspected poor fetal growth, second trimester, fetus 2: Secondary | ICD-10-CM

## 2017-01-09 DIAGNOSIS — F429 Obsessive-compulsive disorder, unspecified: Secondary | ICD-10-CM | POA: Insufficient documentation

## 2017-01-09 DIAGNOSIS — O3090 Multiple gestation, unspecified, unspecified trimester: Secondary | ICD-10-CM | POA: Diagnosis present

## 2017-01-09 DIAGNOSIS — Z3A35 35 weeks gestation of pregnancy: Secondary | ICD-10-CM | POA: Diagnosis not present

## 2017-01-09 DIAGNOSIS — G4733 Obstructive sleep apnea (adult) (pediatric): Secondary | ICD-10-CM | POA: Insufficient documentation

## 2017-01-09 DIAGNOSIS — O30002 Twin pregnancy, unspecified number of placenta and unspecified number of amniotic sacs, second trimester: Secondary | ICD-10-CM | POA: Diagnosis present

## 2017-01-09 DIAGNOSIS — Z3A26 26 weeks gestation of pregnancy: Secondary | ICD-10-CM

## 2017-01-09 DIAGNOSIS — E282 Polycystic ovarian syndrome: Secondary | ICD-10-CM | POA: Diagnosis not present

## 2017-01-09 DIAGNOSIS — O09213 Supervision of pregnancy with history of pre-term labor, third trimester: Secondary | ICD-10-CM | POA: Insufficient documentation

## 2017-01-09 DIAGNOSIS — O99513 Diseases of the respiratory system complicating pregnancy, third trimester: Secondary | ICD-10-CM | POA: Diagnosis not present

## 2017-01-09 DIAGNOSIS — O99333 Smoking (tobacco) complicating pregnancy, third trimester: Secondary | ICD-10-CM | POA: Insufficient documentation

## 2017-01-09 DIAGNOSIS — O99283 Endocrine, nutritional and metabolic diseases complicating pregnancy, third trimester: Secondary | ICD-10-CM | POA: Insufficient documentation

## 2017-01-09 DIAGNOSIS — O0992 Supervision of high risk pregnancy, unspecified, second trimester: Secondary | ICD-10-CM

## 2017-01-09 DIAGNOSIS — Z8751 Personal history of pre-term labor: Secondary | ICD-10-CM | POA: Insufficient documentation

## 2017-01-09 DIAGNOSIS — O09522 Supervision of elderly multigravida, second trimester: Secondary | ICD-10-CM

## 2017-01-09 LAB — URINALYSIS, ROUTINE W REFLEX MICROSCOPIC
BACTERIA UA: NONE SEEN
BILIRUBIN URINE: NEGATIVE
GLUCOSE, UA: NEGATIVE mg/dL
HGB URINE DIPSTICK: NEGATIVE
Ketones, ur: NEGATIVE mg/dL
LEUKOCYTES UA: NEGATIVE
NITRITE: NEGATIVE
Protein, ur: 30 mg/dL — AB
RBC / HPF: NONE SEEN RBC/hpf (ref 0–5)
SPECIFIC GRAVITY, URINE: 1.027 (ref 1.005–1.030)
pH: 7 (ref 5.0–8.0)

## 2017-01-09 LAB — FETAL FIBRONECTIN: Fetal Fibronectin: POSITIVE — AB

## 2017-01-09 MED ORDER — LACTATED RINGERS IV BOLUS (SEPSIS)
1000.0000 mL | Freq: Once | INTRAVENOUS | Status: AC
  Administered 2017-01-09: 1000 mL via INTRAVENOUS

## 2017-01-09 MED ORDER — TERBUTALINE SULFATE 1 MG/ML IJ SOLN
0.2500 mg | Freq: Once | INTRAMUSCULAR | Status: AC
  Administered 2017-01-09: 0.25 mg via SUBCUTANEOUS
  Filled 2017-01-09: qty 1

## 2017-01-09 MED ORDER — BETAMETHASONE SOD PHOS & ACET 6 (3-3) MG/ML IJ SUSP
12.0000 mg | INTRAMUSCULAR | Status: DC
  Administered 2017-01-09: 12 mg via INTRAMUSCULAR
  Filled 2017-01-09: qty 2

## 2017-01-09 MED ORDER — SODIUM CHLORIDE 0.9 % IV SOLN
2.0000 g | Freq: Once | INTRAVENOUS | Status: AC
  Administered 2017-01-09: 2 g via INTRAVENOUS
  Filled 2017-01-09: qty 2000

## 2017-01-09 MED ORDER — SODIUM CHLORIDE 0.9 % IV SOLN
1.0000 g | INTRAVENOUS | Status: DC
Start: 2017-01-09 — End: 2017-01-09

## 2017-01-09 MED ORDER — LACTATED RINGERS IV SOLN
INTRAVENOUS | Status: DC
  Administered 2017-01-09: 16:00:00 via INTRAVENOUS

## 2017-01-09 NOTE — Discharge Summary (Signed)
Physician Discharge Summary  Patient ID: Lauren Tapia MRN: [id number] DOB/AGE: 1985/10/28 31 y.o.  Admit date: 01/09/2017 Discharge date: 01/09/2017  Admission Diagnoses: Preterm contractions  Discharge Diagnoses:  Active Problems:   Labor and delivery indication for care or intervention Preterm labor  Discharged Condition: stable  Hospital Course: Patient seen for scheduled BPP, no feeling any contractions but noted contractions during BPP and funneling of internal cervical os.  FFN collected and positive.  Closed/50/-3 in clinic with change to 1.5/50/-3.  Started on BMZ, ampicillin, magnesium held because of history of allergic reaction.  Based on gestational age, twin gestation, and history of prior PTB [facility]    Consults: None  Significant Diagnostic Studies: Results for orders placed or performed during the hospital encounter of 01/09/17 (from the past 48 hour(s))  Urinalysis, Routine w reflex microscopic     Status: Abnormal   Collection Time: 01/09/17  3:02 PM  Result Value Ref Range   Color, Urine YELLOW (A) YELLOW   APPearance CLEAR (A) CLEAR   Specific Gravity, Urine 1.027 1.005 - 1.030   pH 7.0 5.0 - 8.0   Glucose, UA NEGATIVE NEGATIVE mg/dL   Hgb urine dipstick NEGATIVE NEGATIVE   Bilirubin Urine NEGATIVE NEGATIVE   Ketones, ur NEGATIVE NEGATIVE mg/dL   Protein, ur 30 (A) NEGATIVE mg/dL   Nitrite NEGATIVE NEGATIVE   Leukocytes, UA NEGATIVE NEGATIVE   RBC / HPF NONE SEEN 0 - 5 RBC/hpf   WBC, UA 0-5 0 - 5 WBC/hpf   Bacteria, UA NONE SEEN NONE SEEN   Squamous Epithelial / LPF 0-5 (A) NONE SEEN   Mucous PRESENT      Treatments: IV hydration, antibiotics, steroids  Discharge Exam: Blood pressure 101/65, pulse 82, temperature 98.1 F (36.7 C), temperature source Oral, resp. rate 18, height 5' (1.524 m), weight 158 lb (71.7 kg), last menstrual period 07/05/2016, SpO2 100 %, unknown if currently breastfeeding. General appearance: alert,  appears stated age and no distress Resp: clear to auscultation bilaterally GI: soft, non-tender, gravid Pelvic: 1.5/50/-2 Extremities: extremities normal, atraumatic, no cyanosis or edema  Disposition: 01-Home or Self Care  Discharge Instructions    Discharge activity:  No Restrictions    Complete by:  As directed    Discharge diet:  No restrictions    Complete by:  As directed    No sexual activity restrictions    Complete by:  As directed    Notify physician for a general feeling that "something is not right"    Complete by:  As directed    Notify physician for increase or change in vaginal discharge    Complete by:  As directed    Notify physician for intestinal cramps, with or without diarrhea, sometimes described as "gas pain"    Complete by:  As directed    Notify physician for leaking of fluid    Complete by:  As directed    Notify physician for low, dull backache, unrelieved by heat or Tylenol    Complete by:  As directed    Notify physician for menstrual like cramps    Complete by:  As directed    Notify physician for pelvic pressure    Complete by:  As directed    Notify physician for uterine contractions.  These may be painless and feel like the uterus is tightening or the baby is  "balling up"    Complete by:  As directed    Notify physician for vaginal bleeding  Complete by:  As directed    PRETERM LABOR:  Includes any of the follwing symptoms that occur between 20 - [redacted] weeks gestation.  If these symptoms are not stopped, preterm labor can result in preterm delivery, placing your baby at risk    Complete by:  As directed      Allergies as of 01/09/2017      Reactions   Magnesium Sulfate Swelling   Whole body swelling   Sulfa Antibiotics Other (See Comments)      Medication List    TAKE these medications   albuterol (2.5 MG/3ML) 0.083% nebulizer solution Commonly known as:  PROVENTIL Take 2.5 mg by nebulization every 6 (six) hours as needed for wheezing  or shortness of breath.   budesonide 32 MCG/ACT nasal spray Commonly known as:  RHINOCORT AQUA Place into the nose.   cholecalciferol 1000 units tablet Commonly known as:  VITAMIN D Take 1,000 Units by mouth daily.   prenatal multivitamin Tabs tablet Take 1 tablet by mouth daily at 12 noon.        Signed: Vena Austria 01/09/2017, 3:43 PM

## 2017-01-09 NOTE — H&P (Signed)
Obstetric H&P   Chief Complaint: Contractions  Prenatal Care Provider: [facility]  History of Present Illness: 31 y.o. [id number] [redacted]w[redacted]d by 04/12/2017, by 13 week Ultrasound with a Mo/Di twin gestation presenting to L&D after BPP today for IUGR at [facility] with the patient contracting.  The patient has a history of a prior preterm deliveries, as well as short interpregnancy interval with last delivery 05/12/2016 at 35 weeks 6 days.  FFN collected at [facility] positive, cervix closed at that time with 2cm of funneling noted at the internal cervical os.  Patient currently denies feeling any contractions.  No vaginal bleeding, no LOF, +FM.  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  Dopplers today S/D 2.56 and 2.76.  Last growth 01/02/17 1lbs 6oz 612g 3% on A,  592g or 1lbs 5oz <3% on B  ABO, Rh: --/--/O POS (10/14 1847)  Antibody: NEG (10/14 1847)  Rubella: Immune RPR: Nonreactive (02/08 0000)  HBsAg: Negative (02/08 0000)  HIV: Non-reactive (02/08 0000)  RPR: Nonreactive (02/08 0000) 1-hr: unknown GBS: obtained today  Review of Systems: 10 point review of systems negative unless otherwise noted in HPI  Past Medical History: Past Medical History:  Diagnosis Date  . Asthma   . GAD (generalized anxiety disorder)   . Obsessive compulsive disorder   . OCD (obsessive compulsive disorder)   . OSA on CPAP   . PCOS (polycystic ovarian syndrome)   . Pregnancy induced hypertension    with first pregnancy   . PTSD (post-traumatic stress disorder)     Past Surgical History: History reviewed. No pertinent surgical history.   Family History: Family History  Problem Relation Age of Onset  . Asthma Mother   . Drug abuse Mother   . Early death Mother   . Diabetes Maternal Aunt   . Hypertension Maternal Aunt   . Drug abuse Maternal Aunt   . Drug abuse Maternal Uncle   . Hypertension Maternal Grandmother   . Heart disease Maternal Grandmother   .  Hyperlipidemia Maternal Grandmother   . Stroke Maternal Grandmother     Social History: Social History   Social History  . Marital status: Single    Spouse name: N/A  . Number of children: N/A  . Years of education: N/A   Occupational History  . Not on file.   Social History Main Topics  . Smoking status: Current Every Day Smoker    Packs/day: 0.25    Types: Cigarettes  . Smokeless tobacco: Never Used  . Alcohol use No  . Drug use: No  . Sexual activity: Yes   Other Topics Concern  . Not on file   Social History Narrative  . No narrative on file    Medications: Prior to Admission medications   Medication Sig Start Date End Date Taking? Authorizing Provider  budesonide (RHINOCORT AQUA) 32 MCG/ACT nasal spray Place into the nose.   Yes [provider]  cholecalciferol (VITAMIN D) 1000 units tablet Take 1,000 Units by mouth daily.   Yes [provider]  Prenatal Vit-Fe Fumarate-FA (PRENATAL MULTIVITAMIN) TABS tablet Take 1 tablet by mouth daily at 12 noon.   Yes [provider]  albuterol (PROVENTIL) (2.5 MG/3ML) 0.083% nebulizer solution Take 2.5 mg by nebulization every 6 (six) hours as needed for wheezing or shortness of breath.    [provider]    Allergies: Allergies  Allergen Reactions  . Magnesium Sulfate Swelling    Whole body  swelling  . Sulfa Antibiotics Other (See Comments)    Physical Exam: Vitals: Blood pressure 101/65, pulse 82, temperature 98.1 F (36.7 C), temperature source Oral, resp. rate 18, height 5' (1.524 m), weight 158 lb (71.7 kg), last menstrual period 07/05/2016, SpO2 100 %, unknown if currently breastfeeding.   Urine Dip Protein: N/A  FHT A: 150, moderate, +accels (10x10), no decels FHT B: 150, moderate, no accels, no decels (non contiguous tracing) Toco:  q59min on admission spacced out to q7-8 with IV hydration BPP today 6/8 and 6/8 with two off for no observed fetal breathing for both fetuses    General: NAD HEENT: normocephalic, anicteric Pulmonary: No increased work of breathing Cardiovascular: RRR, distal pulses 2+ Abdomen: Gravid, non-tender Leopolds: vtx/trv ([place]) Genitourinary: 1.5/50/-3 Extremities: no edema, erythema, or tenderness Neurologic: Grossly intact Psychiatric: mood appropriate, affect full  Labs: Results for orders placed or performed during the hospital encounter of 01/09/17 (from the past 24 hour(s))  Urinalysis, Routine w reflex microscopic     Status: Abnormal   Collection Time: 01/09/17  3:02 PM  Result Value Ref Range   Color, Urine YELLOW (A) YELLOW   APPearance CLEAR (A) CLEAR   Specific Gravity, Urine 1.027 1.005 - 1.030   pH 7.0 5.0 - 8.0   Glucose, UA NEGATIVE NEGATIVE mg/dL   Hgb urine dipstick NEGATIVE NEGATIVE   Bilirubin Urine NEGATIVE NEGATIVE   Ketones, ur NEGATIVE NEGATIVE mg/dL   Protein, ur 30 (A) NEGATIVE mg/dL   Nitrite NEGATIVE NEGATIVE   Leukocytes, UA NEGATIVE NEGATIVE   RBC / HPF NONE SEEN 0 - 5 RBC/hpf   WBC, UA 0-5 0 - 5 WBC/hpf   Bacteria, UA NONE SEEN NONE SEEN   Squamous Epithelial / LPF 0-5 (A) NONE SEEN   Mucous PRESENT     Assessment: 31 y.o. [id number] [redacted]w[redacted]d by 04/12/2017, possible preterm labor  Plan: 1) Preterm labor - terbutaline - Ampicillin in the setting of expected delivery prior to 37 weeks GBS prophylaxis is indicated in the setting of unknown GBS status.  (2010 CDC Guidelines "Prevention of Perinatal Group B Streptococcal Disease").  We did discuss should she continue to change given fetus B in transverse position and risk of head entrapment should B present breech recommendation would be cesarean delivery.  - BMZ   2) Fetus - IUGR and history of preterm labor given gestational age will transfer to [place].   3) PNL - see above  4) TDAP - not received this pregnancy  5) Disposition - transfer to [facility]

## 2017-01-09 NOTE — OB Triage Note (Signed)
Pt presents from [facility] with ctx. Pt denies any pain, NVD, bleeding or LOF. Pt denies any burning or hurting when she urinates. Pt states she has had something to eat and drink today and denies intercourse in the last 24 hours. Will continue to monitor.

## 2017-01-09 NOTE — Progress Notes (Addendum)
Cervix noted to be funnelled on 26 5/7 week scan TA  Cervical length 2.2 - 2.6 on EV scan   Pelvic exam  Clitoris pierced Moderate white discharge - slightly fishy  Closed /50% /-5 mid soft  fFN sent -( pt reported no intercourse in > 24 hours )  toco done Ucs q 2-5- did not space out with hydration and rest   Pt sent to L&D for further monitoring Consideration of betamethasone   Transfer to tertiary care NICU if cervix changes  Jimmey Ralph, MD

## 2017-01-11 ENCOUNTER — Observation Stay
Admission: EM | Admit: 2017-01-11 | Discharge: 2017-01-11 | Disposition: A | Discharge status: Home / Self Care | Payer: Medicaid Other | Attending: Obstetrics & Gynecology | Admitting: Obstetrics & Gynecology

## 2017-01-11 DIAGNOSIS — F4310 Post-traumatic stress disorder, unspecified: Secondary | ICD-10-CM | POA: Insufficient documentation

## 2017-01-11 DIAGNOSIS — F429 Obsessive-compulsive disorder, unspecified: Secondary | ICD-10-CM | POA: Diagnosis not present

## 2017-01-11 DIAGNOSIS — Z3A27 27 weeks gestation of pregnancy: Secondary | ICD-10-CM | POA: Diagnosis not present

## 2017-01-11 DIAGNOSIS — F419 Anxiety disorder, unspecified: Secondary | ICD-10-CM | POA: Insufficient documentation

## 2017-01-11 DIAGNOSIS — O26899 Other specified pregnancy related conditions, unspecified trimester: Secondary | ICD-10-CM | POA: Diagnosis present

## 2017-01-11 DIAGNOSIS — J45909 Unspecified asthma, uncomplicated: Secondary | ICD-10-CM | POA: Diagnosis not present

## 2017-01-11 DIAGNOSIS — G4733 Obstructive sleep apnea (adult) (pediatric): Secondary | ICD-10-CM | POA: Diagnosis not present

## 2017-01-11 DIAGNOSIS — F17210 Nicotine dependence, cigarettes, uncomplicated: Secondary | ICD-10-CM | POA: Insufficient documentation

## 2017-01-11 DIAGNOSIS — Z79899 Other long term (current) drug therapy: Secondary | ICD-10-CM | POA: Diagnosis not present

## 2017-01-11 DIAGNOSIS — N898 Other specified noninflammatory disorders of vagina: Secondary | ICD-10-CM | POA: Diagnosis not present

## 2017-01-11 DIAGNOSIS — R109 Unspecified abdominal pain: Secondary | ICD-10-CM

## 2017-01-11 LAB — CULTURE, BETA STREP (GROUP B ONLY)

## 2017-01-11 MED ORDER — ACETAMINOPHEN 325 MG PO TABS: 650.0000 mg | ORAL_TABLET | ORAL | Status: DC | PRN

## 2017-01-11 MED ORDER — ONDANSETRON HCL 4 MG/2ML IJ SOLN: 4.0000 mg | Freq: Four times a day (QID) | INTRAMUSCULAR | Status: DC | PRN

## 2017-01-11 NOTE — OB Triage Note (Signed)
Patient given discharge instructions including follow up care, preterm labor precautions, and fetal kick count instructions- verbalized understanding.

## 2017-01-11 NOTE — Final Progress Note (Signed)
Physician Final Progress Note  Patient ID: Lauren Tapia MRN: [id number] DOB/AGE: 12/31/1985 31 y.o.  Admit date: 01/11/2017 Admitting provider: Nadara Mustard, MD Discharge date: 01/11/2017   Admission Diagnoses: 27 weeks twins with vaginal discharge, concern for PROM  Discharge Diagnoses: 27 weeks twins with vaginal discharge  Consults: None  Significant Findings/ Diagnostic Studies: Pt presents with LOF/ vag discharge today.  No pain.  Was seen at [facility] and at [facility] Th[facility]- Fri for concern for PTL, but ruled out and was sent home on labor precautuions and modified bed rest and pelvis rest.  Good FM.  No itch, burn, odor, pain, bleeding. PMHx: She  has a past medical history of Asthma; GAD (generalized anxiety disorder); Obsessive compulsive disorder; OCD (obsessive compulsive disorder); OSA on CPAP; PCOS (polycystic ovarian syndrome); Pregnancy induced hypertension; and PTSD (post-traumatic stress disorder). Also,  has no past surgical history on file., family history includes Asthma in her mother; Diabetes in her maternal aunt; Drug abuse in her maternal aunt, maternal uncle, and mother; Early death in her mother; Heart disease in her maternal grandmother; Hyperlipidemia in her maternal grandmother; Hypertension in her maternal aunt and maternal grandmother; Stroke in her maternal grandmother.,  reports that she has been smoking Cigarettes.  She has been smoking about 0.25 packs per day. She has never used smokeless tobacco. She reports that she does not drink alcohol or use drugs. Current Meds  Medication Sig  . budesonide (RHINOCORT AQUA) 32 MCG/ACT nasal spray Place into the nose.  . cholecalciferol (VITAMIN D) 1000 units tablet Take 1,000 Units by mouth daily.  . Prenatal Vit-Fe Fumarate-FA (PRENATAL MULTIVITAMIN) TABS tablet Take 1 tablet by mouth daily at 12 noon.  . Also, is allergic to magnesium sulfate and sulfa antibiotics..  Review of Systems  Constitutional: Negative for  chills, fever and malaise/fatigue.  HENT: Negative for congestion, sinus pain and sore throat.   Eyes: Negative for blurred vision and pain.  Respiratory: Negative for cough and wheezing.   Cardiovascular: Negative for chest pain and leg swelling.  Gastrointestinal: Negative for abdominal pain, constipation, diarrhea, heartburn, nausea and vomiting.  Genitourinary: Negative for dysuria, frequency, hematuria and urgency.  Musculoskeletal: Negative for back pain, joint pain, myalgias and neck pain.  Skin: Negative for itching and rash.  Neurological: Negative for dizziness, tremors and weakness.  Endo/Heme/Allergies: Does not bruise/bleed easily.  Psychiatric/Behavioral: Negative for depression. The patient is not nervous/anxious and does not have insomnia.   Physical examination Constitutional NAD, Conversant  Skin No rashes, lesions or ulceration. Normal palpated skin turgor. No nodularity.  Lungs: Clear to auscultation.No rales or wheezes. Normal Respiratory effort, no retractions.  Heart: NSR.  No murmurs or rubs appreciated. No periferal edema  Abdomen: Gravid.  Non-tender.  No masses.  No HSM. No hernia  Extremities: Moves all appropriately.  Normal ROM for age. No lymphadenopathy.  Neuro: Grossly intact  Psych: Oriented to PPT.  Normal mood. Normal affect.     Pelvic:   Vulva: Normal appearance.  No lesions.  Vagina: No lesions or abnormalities noted.  Urethra No masses tenderness or scarring.  Meatus Normal size without lesions or prolapse.  Cervix: 1/50/-3.   Perineum: Normal exam.  No lesions.        Bimanual   Uterus: Enlarged.  Non-tender.    Adnexae: Not palpated.  Cul-de-sac: Negative for abnormality.  Pool + Nitrazine - [place] -  Procedures: FHT on both 150s  Discharge Condition: good  Disposition: [facility] Not Defined  Diet: Regular diet  Discharge Activity: Activity as tolerated   Allergies as of 01/11/2017      Reactions    Magnesium Sulfate Swelling   Whole body swelling   Sulfa Antibiotics Other (See Comments)      Medication List    TAKE these medications   albuterol (2.5 MG/3ML) 0.083% nebulizer solution Commonly known as:  PROVENTIL Take 2.5 mg by nebulization every 6 (six) hours as needed for wheezing or shortness of breath.   budesonide 32 MCG/ACT nasal spray Commonly known as:  RHINOCORT AQUA Place into the nose.   cholecalciferol 1000 units tablet Commonly known as:  VITAMIN D Take 1,000 Units by mouth daily.   prenatal multivitamin Tabs tablet Take 1 tablet by mouth daily at 12 noon.      Keep appt in 48 hours Total time spent taking care of this patient: 30 minutes  Signed: Letitia Libra 01/11/2017, 8:03 PM

## 2017-01-11 NOTE — OB Triage Note (Signed)
Patient arrived in triage with c/o leaking of fluid since approx. 1625. Denies feeling contractions and vaginal bleeding. Reports feeling babies move.  EFM applied per Lennox Pippins RN and assessing. Discussed plan of care. Patient verbalized understanding.

## 2017-01-11 NOTE — Discharge Summary (Signed)
  See FPN 

## 2017-01-13 ENCOUNTER — Ambulatory Visit
Admission: RE | Admit: 2017-01-13 | Discharge: 2017-01-13 | Disposition: A | Discharge status: Home / Self Care | Payer: Medicaid Other | Source: Ambulatory Visit | Attending: Maternal and Fetal Medicine | Admitting: Maternal and Fetal Medicine

## 2017-01-13 DIAGNOSIS — O365922 Maternal care for other known or suspected poor fetal growth, second trimester, fetus 2: Secondary | ICD-10-CM | POA: Diagnosis not present

## 2017-01-13 DIAGNOSIS — Z3A27 27 weeks gestation of pregnancy: Secondary | ICD-10-CM | POA: Insufficient documentation

## 2017-01-16 ENCOUNTER — Other Ambulatory Visit: Payer: Self-pay | Admitting: *Deleted

## 2017-01-16 DIAGNOSIS — O365912 Maternal care for other known or suspected poor fetal growth, first trimester, fetus 2: Secondary | ICD-10-CM

## 2017-01-20 ENCOUNTER — Other Ambulatory Visit: Payer: Self-pay | Admitting: *Deleted

## 2017-01-20 ENCOUNTER — Ambulatory Visit
Admission: RE | Admit: 2017-01-20 | Discharge: 2017-01-20 | Disposition: A | Discharge status: Home / Self Care | Payer: No Typology Code available for payment source | Source: Ambulatory Visit | Attending: Maternal & Fetal Medicine | Admitting: Maternal & Fetal Medicine

## 2017-01-20 DIAGNOSIS — O30009 Twin pregnancy, unspecified number of placenta and unspecified number of amniotic sacs, unspecified trimester: Secondary | ICD-10-CM

## 2017-01-20 DIAGNOSIS — O365912 Maternal care for other known or suspected poor fetal growth, first trimester, fetus 2: Secondary | ICD-10-CM

## 2017-01-21 ENCOUNTER — Ambulatory Visit (INDEPENDENT_AMBULATORY_CARE_PROVIDER_SITE_OTHER): Payer: Medicaid Other | Admitting: Obstetrics and Gynecology

## 2017-01-21 ENCOUNTER — Other Ambulatory Visit: Payer: Medicaid Other

## 2017-01-21 VITALS — BP 124/70 | Wt 162.0 lb

## 2017-01-21 DIAGNOSIS — Z131 Encounter for screening for diabetes mellitus: Secondary | ICD-10-CM

## 2017-01-21 DIAGNOSIS — Z130 Encounter for screening for diseases of the blood and blood-forming organs and certain disorders involving the immune mechanism: Secondary | ICD-10-CM

## 2017-01-21 DIAGNOSIS — F1010 Alcohol abuse, uncomplicated: Secondary | ICD-10-CM

## 2017-01-21 DIAGNOSIS — J4520 Mild intermittent asthma, uncomplicated: Secondary | ICD-10-CM

## 2017-01-21 DIAGNOSIS — O30032 Twin pregnancy, monochorionic/diamniotic, second trimester: Secondary | ICD-10-CM

## 2017-01-21 DIAGNOSIS — Z113 Encounter for screening for infections with a predominantly sexual mode of transmission: Secondary | ICD-10-CM

## 2017-01-21 DIAGNOSIS — E669 Obesity, unspecified: Secondary | ICD-10-CM

## 2017-01-21 DIAGNOSIS — Z720 Tobacco use: Secondary | ICD-10-CM

## 2017-01-21 DIAGNOSIS — Z8751 Personal history of pre-term labor: Secondary | ICD-10-CM

## 2017-01-21 DIAGNOSIS — O0992 Supervision of high risk pregnancy, unspecified, second trimester: Secondary | ICD-10-CM

## 2017-01-21 NOTE — Progress Notes (Signed)
Pt c/o some cramping in legs. No vb. No lof. 28 week labs today. appt at [facility] PN yesterday with growth. Both babies <10th %ile, normal UA dopps.  [facility] following with twice weekly BPPs. 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 Being referred to ped urology at [facility].  Movement good x 2 babies FHR normal x 2.

## 2017-01-22 LAB — 28 WEEK RH+PANEL
Basophils Absolute: 0 10*3/uL (ref 0.0–0.2)
Basos: 0 %
EOS (ABSOLUTE): 0.1 10*3/uL (ref 0.0–0.4)
Eos: 1 %
Gestational Diabetes Screen: 120 mg/dL (ref 65–139)
HEMATOCRIT: 28.7 % — AB (ref 34.0–46.6)
HEMOGLOBIN: 9.6 g/dL — AB (ref 11.1–15.9)
HIV SCREEN 4TH GENERATION: NONREACTIVE
IMMATURE GRANS (ABS): 0.3 10*3/uL — AB (ref 0.0–0.1)
Immature Granulocytes: 3 %
LYMPHS ABS: 2.1 10*3/uL (ref 0.7–3.1)
LYMPHS: 18 %
MCH: 33.2 pg — ABNORMAL HIGH (ref 26.6–33.0)
MCHC: 33.4 g/dL (ref 31.5–35.7)
MCV: 99 fL — ABNORMAL HIGH (ref 79–97)
MONOCYTES: 5 %
Monocytes Absolute: 0.6 10*3/uL (ref 0.1–0.9)
NEUTROS ABS: 8.5 10*3/uL — AB (ref 1.4–7.0)
Neutrophils: 73 %
Platelets: 237 10*3/uL (ref 150–379)
RBC: 2.89 x10E6/uL — AB (ref 3.77–5.28)
RDW: 14.1 % (ref 12.3–15.4)
RPR Ser Ql: NONREACTIVE
WBC: 11.6 10*3/uL — AB (ref 3.4–10.8)

## 2017-01-23 ENCOUNTER — Other Ambulatory Visit: Payer: Self-pay | Admitting: *Deleted

## 2017-01-23 ENCOUNTER — Ambulatory Visit
Admission: RE | Admit: 2017-01-23 | Discharge: 2017-01-23 | Disposition: A | Discharge status: Home / Self Care | Payer: Medicaid Other | Source: Ambulatory Visit | Attending: Obstetrics and Gynecology | Admitting: Obstetrics and Gynecology

## 2017-01-23 DIAGNOSIS — Z3A28 28 weeks gestation of pregnancy: Secondary | ICD-10-CM | POA: Diagnosis not present

## 2017-01-23 DIAGNOSIS — O30033 Twin pregnancy, monochorionic/diamniotic, third trimester: Secondary | ICD-10-CM | POA: Diagnosis not present

## 2017-01-23 DIAGNOSIS — O30009 Twin pregnancy, unspecified number of placenta and unspecified number of amniotic sacs, unspecified trimester: Secondary | ICD-10-CM

## 2017-01-23 DIAGNOSIS — O30039 Twin pregnancy, monochorionic/diamniotic, unspecified trimester: Secondary | ICD-10-CM

## 2017-01-26 ENCOUNTER — Ambulatory Visit (HOSPITAL_COMMUNITY)
Admission: AD | Admit: 2017-01-26 | Discharge: 2017-01-26 | Disposition: A | Discharge status: Home / Self Care | Payer: Non-veteran care | Source: Other Acute Inpatient Hospital | Attending: Obstetrics and Gynecology | Admitting: Obstetrics and Gynecology

## 2017-01-26 ENCOUNTER — Observation Stay
Admission: EM | Admit: 2017-01-26 | Discharge: 2017-01-26 | Disposition: A | Discharge status: Other Acute Inpatient Hospital | Payer: Non-veteran care | Attending: Obstetrics and Gynecology | Admitting: Obstetrics and Gynecology

## 2017-01-26 ENCOUNTER — Encounter: Payer: Self-pay | Admitting: *Deleted

## 2017-01-26 DIAGNOSIS — O99353 Diseases of the nervous system complicating pregnancy, third trimester: Secondary | ICD-10-CM | POA: Diagnosis not present

## 2017-01-26 DIAGNOSIS — G4733 Obstructive sleep apnea (adult) (pediatric): Secondary | ICD-10-CM | POA: Diagnosis not present

## 2017-01-26 DIAGNOSIS — Z3A00 Weeks of gestation of pregnancy not specified: Secondary | ICD-10-CM | POA: Diagnosis not present

## 2017-01-26 DIAGNOSIS — O2690 Pregnancy related conditions, unspecified, unspecified trimester: Secondary | ICD-10-CM | POA: Diagnosis not present

## 2017-01-26 DIAGNOSIS — Z8751 Personal history of pre-term labor: Secondary | ICD-10-CM | POA: Diagnosis not present

## 2017-01-26 DIAGNOSIS — Z7982 Long term (current) use of aspirin: Secondary | ICD-10-CM | POA: Insufficient documentation

## 2017-01-26 DIAGNOSIS — Z79899 Other long term (current) drug therapy: Secondary | ICD-10-CM | POA: Insufficient documentation

## 2017-01-26 DIAGNOSIS — O365930 Maternal care for other known or suspected poor fetal growth, third trimester, not applicable or unspecified: Secondary | ICD-10-CM | POA: Insufficient documentation

## 2017-01-26 DIAGNOSIS — F17210 Nicotine dependence, cigarettes, uncomplicated: Secondary | ICD-10-CM | POA: Insufficient documentation

## 2017-01-26 DIAGNOSIS — J45909 Unspecified asthma, uncomplicated: Secondary | ICD-10-CM | POA: Diagnosis not present

## 2017-01-26 DIAGNOSIS — O99283 Endocrine, nutritional and metabolic diseases complicating pregnancy, third trimester: Secondary | ICD-10-CM | POA: Diagnosis not present

## 2017-01-26 DIAGNOSIS — E282 Polycystic ovarian syndrome: Secondary | ICD-10-CM | POA: Diagnosis not present

## 2017-01-26 DIAGNOSIS — Z3A29 29 weeks gestation of pregnancy: Secondary | ICD-10-CM | POA: Diagnosis not present

## 2017-01-26 DIAGNOSIS — O99513 Diseases of the respiratory system complicating pregnancy, third trimester: Secondary | ICD-10-CM | POA: Insufficient documentation

## 2017-01-26 DIAGNOSIS — O30003 Twin pregnancy, unspecified number of placenta and unspecified number of amniotic sacs, third trimester: Secondary | ICD-10-CM | POA: Diagnosis not present

## 2017-01-26 DIAGNOSIS — O99333 Smoking (tobacco) complicating pregnancy, third trimester: Secondary | ICD-10-CM | POA: Diagnosis not present

## 2017-01-26 DIAGNOSIS — Z7951 Long term (current) use of inhaled steroids: Secondary | ICD-10-CM | POA: Insufficient documentation

## 2017-01-26 LAB — COMPREHENSIVE METABOLIC PANEL
ALT: 10 U/L — AB (ref 14–54)
AST: 17 U/L (ref 15–41)
Albumin: 3.8 g/dL (ref 3.5–5.0)
Alkaline Phosphatase: 83 U/L (ref 38–126)
Anion gap: 5 (ref 5–15)
BUN: 6 mg/dL (ref 6–20)
CHLORIDE: 105 mmol/L (ref 101–111)
CO2: 25 mmol/L (ref 22–32)
CREATININE: 0.55 mg/dL (ref 0.44–1.00)
Calcium: 9.8 mg/dL (ref 8.9–10.3)
Glucose, Bld: 74 mg/dL (ref 65–99)
POTASSIUM: 3.7 mmol/L (ref 3.5–5.1)
Sodium: 135 mmol/L (ref 135–145)
TOTAL PROTEIN: 7.4 g/dL (ref 6.5–8.1)
Total Bilirubin: 0.7 mg/dL (ref 0.3–1.2)

## 2017-01-26 LAB — TYPE AND SCREEN
ABO/RH(D): O POS
ANTIBODY SCREEN: NEGATIVE

## 2017-01-26 LAB — CBC
HCT: 32.2 % — ABNORMAL LOW (ref 35.0–47.0)
Hemoglobin: 11.1 g/dL — ABNORMAL LOW (ref 12.0–16.0)
MCH: 34.2 pg — ABNORMAL HIGH (ref 26.0–34.0)
MCHC: 34.4 g/dL (ref 32.0–36.0)
MCV: 99.3 fL (ref 80.0–100.0)
PLATELETS: 269 10*3/uL (ref 150–440)
RBC: 3.24 MIL/uL — ABNORMAL LOW (ref 3.80–5.20)
RDW: 13.8 % (ref 11.5–14.5)
WBC: 11 10*3/uL (ref 3.6–11.0)

## 2017-01-26 MED ORDER — MAGNESIUM SULFATE 50 % IJ SOLN: 2.0000 g/h | INTRAMUSCULAR | Status: DC

## 2017-01-26 MED ORDER — MAGNESIUM SULFATE 50 % IJ SOLN
INTRAVENOUS | Status: AC
  Filled 2017-01-26: qty 80

## 2017-01-26 MED ORDER — MAGNESIUM SULFATE BOLUS VIA INFUSION
4.0000 g | INTRAVENOUS | Status: AC
Start: 2017-01-26 — End: 2017-01-26
  Administered 2017-01-26: 4 g via INTRAVENOUS
  Filled 2017-01-26: qty 500

## 2017-01-26 NOTE — Progress Notes (Signed)
EMS here to transport patient:  Recheck of cervix: 3/50/-3 (unchanged)  FHR: both fetuses category 1 at this time.  Patient OK for transfer based on current clinical picture of clinical stability for patient and fetuses.   Thomasene Mohair, MD 01/26/2017 8:14 PM

## 2017-01-26 NOTE — H&P (Signed)
Obstetric H&P   Chief Complaint: Contractions  Prenatal Care Provider: [facility]  History of Present Illness: 31 y.o. [id number] at [redacted]w[redacted]d with EDD 04/12/2017 by 13 week Ultrasound with a Mo/Di twin gestation. 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  Her pregnancy is further complicated by a history of preterm delivery x2 and has had a short interpregnancy interval with last delivery 05/12/2016 at 35 weeks 6 days.  She has had preterm labor without delivery two weeks ago. She dilated to 1.5cm and was given betamethasone x 2 on 6/14 and 6/15.  She was transferred to [facility] where she was monitored and discharged without further labor changes and cervical exam of 1cm..  She presents today to labor and delivery with regular uterine contractions since mid morning today.  They are mild-moderate and she has not timed them. No vaginal bleeding, no LOF, +FM x 2.  Last growth 01/02/17 1lbs 6oz 612g 3% on A,  592g or 1lbs 5oz <3% on B  ABO, Rh: --/--/O POS (10/14 1847)  Antibody: NEG (10/14 1847)  Rubella: Immune RPR: Non Reactive (06/26 0824)  HBsAg: Negative (02/08 0000)  HIV: Non Reactive (06/26 0824)  RPR: Non Reactive (06/26 0824) 1-hr: unknown GBS: negative on 01/09/17  Review of Systems:  Review of Systems  Constitutional: Negative.   HENT: Negative.   Eyes: Negative.   Respiratory: Negative.   Cardiovascular: Negative.   Gastrointestinal: Negative for diarrhea, nausea and vomiting.       See HPI  Genitourinary: Negative.   Musculoskeletal: Negative.   Skin: Negative.   Neurological: Negative.   Psychiatric/Behavioral: Negative.    Past Medical History:  Diagnosis Date  . Asthma   . GAD (generalized anxiety disorder)   . Obsessive compulsive disorder   . OCD (obsessive compulsive disorder)   . OSA on CPAP   . PCOS (polycystic ovarian syndrome)   . Pregnancy induced hypertension    with first pregnancy   . PTSD  (post-traumatic stress disorder)     Past Surgical History:  Procedure Laterality Date  . NO PAST SURGERIES    . TOOTH EXTRACTION      Family History  Problem Relation Age of Onset  . Asthma Mother   . Drug abuse Mother   . Early death Mother   . Diabetes Maternal Aunt   . Hypertension Maternal Aunt   . Drug abuse Maternal Aunt   . Drug abuse Maternal Uncle   . Hypertension Maternal Grandmother   . Heart disease Maternal Grandmother   . Hyperlipidemia Maternal Grandmother   . Stroke Maternal Grandmother     Social History   Social History  . Marital status: Single    Spouse name: N/A  . Number of children: N/A  . Years of education: N/A   Occupational History  . Not on file.   Social History Main Topics  . Smoking status: Current Every Day Smoker    Packs/day: 0.25    Types: Cigarettes  . Smokeless tobacco: Never Used  . Alcohol use No  . Drug use: No  . Sexual activity: Yes   Other Topics Concern  . Not on file   Social History Narrative  . No narrative on file    Medications:   Medication Sig Start Date End Date Taking? Authorizing Provider  budesonide (RHINOCORT AQUA) 32 MCG/ACT nasal spray Place into the nose.   Yes [provider]  cholecalciferol (VITAMIN D) 1000 units tablet  Take 1,000 Units by mouth daily.   Yes [provider]  Prenatal Vit-Fe Fumarate-FA (PRENATAL MULTIVITAMIN) TABS tablet Take 1 tablet by mouth daily at 12 noon.   Yes [provider]  albuterol (PROVENTIL) (2.5 MG/3ML) 0.083% nebulizer solution Take 2.5 mg by nebulization every 6 (six) hours as needed for wheezing or shortness of breath.    [provider]    Allergies  Allergen Reactions  . Magnesium Sulfate Swelling    Whole body swelling  . Sulfa Antibiotics Other (See Comments)   Physical Exam: BP (!) 103/54 (BP Location: Left Arm)   Pulse 68   Temp 98.6 F (37 C) (Oral)   Resp 18   Ht 5' (1.524 m)   Wt 157 lb (71.2 kg)   LMP  07/05/2016 (Approximate)   BMI 30.66 kg/m    FHT A: 140, moderate variability, +accels (15x15), no decels FHT B: 140, moderate variability, +accels, no decels  Toco:  2-3 contractions q 10 mins  Physical Exam  Constitutional: She is oriented to person, place, and time. She appears well-developed and well-nourished. No distress.  HENT:  Head: Normocephalic and atraumatic.  Eyes: Conjunctivae are normal.  Neck: Normal range of motion. Neck supple.  Cardiovascular: Normal rate, regular rhythm and normal heart sounds.  Exam reveals no gallop and no friction rub.   No murmur heard. Pulmonary/Chest: Effort normal and breath sounds normal. She has no wheezes.  Abdominal: Soft. Hernia confirmed negative in the right inguinal area and confirmed negative in the left inguinal area.  Soft, gravid, NT, +BS  Genitourinary: Pelvic exam was performed with patient supine. There is no rash, tenderness or lesion on the right labia. There is no rash, tenderness or lesion on the left labia.  Genitourinary Comments: CVX: 3/50/-3  Musculoskeletal: Normal range of motion.  Lymphadenopathy:       Right: No inguinal adenopathy present.       Left: No inguinal adenopathy present.  Neurological: She is alert and oriented to person, place, and time.  Skin: Skin is warm and dry. No rash noted.  Psychiatric: She has a normal mood and affect. Her behavior is normal. Judgment normal.   Female chaperone present for pelvic exam  Imaging: Bedside ultrasound Fetus A: cephalic, maternal right Fetus B: transverse, back anterior, head maternal right Fluid subjectively normal for both sacs  Assessment: 31 y.o. [id number] [redacted]w[redacted]d with EDD 04/12/2017, preterm labor  Plan: 1) Preterm labor - No antibiotics given GBS negative status two weeks - BMZ. Per Dr. Henrene Hawking at [facility], they will consider whether she should receive.  Last received course on 6/14-6/15 - magnesium sulfate (4 gram load, 2 gram maintenance dose)  for tocolysis and fetal neuroprotection  2) Fetus - IUGR and history of preterm labor given gestational age will transfer to [place].   3) PNL - see above  4) TDAP - not received this pregnancy  5) Disposition - transfer to Effie Shy, MD 01/26/2017 6:29 PM

## 2017-01-27 ENCOUNTER — Other Ambulatory Visit: Payer: Self-pay | Admitting: *Deleted

## 2017-01-27 ENCOUNTER — Inpatient Hospital Stay: Admission: RE | Admit: 2017-01-27 | Payer: Medicaid Other | Source: Ambulatory Visit

## 2017-01-27 DIAGNOSIS — O30039 Twin pregnancy, monochorionic/diamniotic, unspecified trimester: Secondary | ICD-10-CM

## 2017-01-30 ENCOUNTER — Ambulatory Visit
Admission: RE | Admit: 2017-01-30 | Discharge: 2017-01-30 | Disposition: A | Discharge status: Home / Self Care | Payer: Non-veteran care | Source: Ambulatory Visit | Attending: Maternal & Fetal Medicine | Admitting: Maternal & Fetal Medicine

## 2017-01-30 ENCOUNTER — Other Ambulatory Visit: Payer: Self-pay | Admitting: *Deleted

## 2017-01-30 DIAGNOSIS — O30039 Twin pregnancy, monochorionic/diamniotic, unspecified trimester: Secondary | ICD-10-CM | POA: Insufficient documentation

## 2017-01-31 ENCOUNTER — Telehealth: Payer: Self-pay

## 2017-01-31 ENCOUNTER — Encounter: Payer: Self-pay | Admitting: *Deleted

## 2017-01-31 ENCOUNTER — Observation Stay
Admission: EM | Admit: 2017-01-31 | Discharge: 2017-01-31 | Disposition: A | Discharge status: Home / Self Care | Payer: Non-veteran care | Attending: Obstetrics and Gynecology | Admitting: Obstetrics and Gynecology

## 2017-01-31 DIAGNOSIS — O30033 Twin pregnancy, monochorionic/diamniotic, third trimester: Principal | ICD-10-CM | POA: Insufficient documentation

## 2017-01-31 DIAGNOSIS — Z3A28 28 weeks gestation of pregnancy: Secondary | ICD-10-CM | POA: Diagnosis not present

## 2017-01-31 DIAGNOSIS — Z7982 Long term (current) use of aspirin: Secondary | ICD-10-CM | POA: Insufficient documentation

## 2017-01-31 DIAGNOSIS — O99343 Other mental disorders complicating pregnancy, third trimester: Secondary | ICD-10-CM | POA: Diagnosis not present

## 2017-01-31 DIAGNOSIS — Z79899 Other long term (current) drug therapy: Secondary | ICD-10-CM | POA: Diagnosis not present

## 2017-01-31 DIAGNOSIS — F17210 Nicotine dependence, cigarettes, uncomplicated: Secondary | ICD-10-CM | POA: Diagnosis not present

## 2017-01-31 DIAGNOSIS — J45909 Unspecified asthma, uncomplicated: Secondary | ICD-10-CM | POA: Diagnosis not present

## 2017-01-31 DIAGNOSIS — G4733 Obstructive sleep apnea (adult) (pediatric): Secondary | ICD-10-CM | POA: Insufficient documentation

## 2017-01-31 LAB — URINALYSIS, COMPLETE (UACMP) WITH MICROSCOPIC
BACTERIA UA: NONE SEEN
BILIRUBIN URINE: NEGATIVE
Glucose, UA: NEGATIVE mg/dL
HGB URINE DIPSTICK: NEGATIVE
Ketones, ur: NEGATIVE mg/dL
LEUKOCYTES UA: NEGATIVE
NITRITE: NEGATIVE
PROTEIN: NEGATIVE mg/dL
RBC / HPF: NONE SEEN RBC/hpf (ref 0–5)
Specific Gravity, Urine: 1.016 (ref 1.005–1.030)
pH: 6 (ref 5.0–8.0)

## 2017-01-31 NOTE — Telephone Encounter (Signed)
Pt 30wk c twins, c/o dilated 3cm and having a lot of pressure, doesn't feel ctxs.  Adv to go to L&D to be ck'd for ctxs, uti, etc.  L&D notified.

## 2017-01-31 NOTE — Discharge Summary (Signed)
See final H&P and final progress note

## 2017-01-31 NOTE — Progress Notes (Signed)
Dr Jean Rosenthal notified of CTX pattern and U/A results. MD aware that pt reports feeling tightening with CTX but no pain. Pt occassionally unaware of CTX. Orders received to continue to monitor pt

## 2017-01-31 NOTE — Progress Notes (Signed)
DR Jean Rosenthal present at bedside. Bedside u/s done. EFM x 2 reapplied.

## 2017-01-31 NOTE — Discharge Instructions (Signed)
Discharge instructions and teaching discussed. All questions answered.  Follow up with next prenatal appointment on Monday 7/9

## 2017-01-31 NOTE — Progress Notes (Signed)
Unable to determine fetal or maternal HR. MD notified. Doppler obtained. Doppler rate 130. Continued to readjust. Pulse ox in place

## 2017-01-31 NOTE — Final Progress Note (Signed)
Physician Final Progress Note  Patient ID: Lauren Tapia MRN: [id number] DOB/AGE: 1985/12/30 31 y.o.  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 [phone] female at [redacted]w[redacted]d dated by 8 week ultrasound.  Her pregnancy has been complicated by mo/di twin gestation, IUGR of both twins, twin B with multicystic dysplastic kidney, history of preterm delivery x 2 with short interpregnancy interval with last delivery 04/2016. She further has had preterm labor this pregnancy with dilation 3 weeks ago to 1.5 cm. She was given betamethasone x 2 on 6/14 and 6/15.  He was transferred to [facility] where she was monitored and discharged without further labor changes and cervical exam of 1cm.  5 days ago she presented to [facility] L&[facility] where she was having preterm labor and was dilated to 3 cm. She was transferred to [facility], where she again remained stable and was discharged on 01/28/17. She received another course of betamethasone at [facility] during that admission.  She presents today with pelvic pressure.  She thinks she lost her mucous plug last night, but has had no further discharge.  She has no vaginal symptoms of abnormal discharge, odor, itching or irritation.  She denies bladder symptoms. Denies GI symptoms. She has had no fever or chills.   She denies  contractions.   She denies leakage of fluid.   She denies vaginal bleeding.   She reports fetal movement x 2 babies.    Last growth 01/02/2017: Fetus A: 612 grams (1 lb 6 oz) 3rd %ile Fetus B: 592 grams (1 lb 5 oz) <3rd %ileTimisha Tapia is a 31 y.o. [id number] female at [redacted]w[redacted]d dated by 8 week ultrasound.  Her pregnancy has been complicated by mo/di twin gestation, IUGR of both twins, twin B with multicystic dysplastic kidney, history of preterm delivery x 2 with short interpregnancy interval with last delivery 04/2016. She further has had preterm labor this pregnancy with dilation 3 weeks ago to 1.5 cm. She was given betamethasone x 2 on 6/14 and 6/15.  He was transferred to [facility] where she was monitored and discharged without further labor changes and cervical exam of 1cm.  5 days ago she presented to [facility] L&D where she was having preterm labor and was dilated to 3 cm. She was transferred to [facility], where she again remained stable and was discharged on 01/28/17. She received another course of betamethasone at [facility] during that admission.  She presents today with pelvic pressure.  She thinks she lost her mucous plug last night, but has had no further discharge.  She has no vaginal symptoms of abnormal discharge, odor, itching or irritation.  She denies bladder symptoms. Denies GI symptoms. She has had no fever or chills.   She denies contractions.   She denies leakage of fluid.   She denies vaginal bleeding.   She reports fetal movement x 2 babies.    Last growth  01/02/2017: Fetus A: 612 grams (1 lb 6 oz) 3rd %ile Fetus B: 592 grams (1 lb 5 oz) <3rd %ile  Hospital Course: Patient admitted for observation.  Cervix without change from 3 days ago.  She had a normal UA and has had a significant workup over the past week.  She was discharged after reassuring fetal testing. She has close follow up and knows when to return to the hospital. I discussed her actual due date  based on her earliest u/s giving her an EDD of 9/22 and a current gestational age of [redacted]w[redacted]d.  Preterm labor precautions were given to her.   Past Medical History:  Diagnosis Date  . Asthma   . GAD (generalized anxiety disorder)   . Obsessive compulsive disorder   . OCD (obsessive compulsive disorder)   . OSA on CPAP   . PCOS (polycystic ovarian syndrome)   . Pregnancy induced hypertension    with first pregnancy   . PTSD (post-traumatic stress disorder)     Past Surgical History:  Procedure Laterality Date  . NO PAST SURGERIES    . TOOTH EXTRACTION      No current facility-administered medications on file prior to encounter.    Current Outpatient Prescriptions on File Prior to Encounter  Medication Sig Dispense Refill  . aspirin EC 81 MG tablet Take 81 mg by mouth daily.    . cholecalciferol (VITAMIN D) 1000 units tablet Take 1,000 Units by mouth daily.    . Prenatal Vit-Fe Fumarate-FA (PRENATAL MULTIVITAMIN) TABS tablet Take 1 tablet by mouth daily at 12 noon.    Marland Kitchen albuterol (PROVENTIL) (2.5 MG/3ML) 0.083% nebulizer solution Take 2.5 mg by nebulization every 6 (six) hours as needed for wheezing or shortness of breath.    . budesonide (RHINOCORT AQUA) 32 MCG/ACT nasal spray Place into the nose.      Allergies  Allergen Reactions  . Magnesium Sulfate Swelling    Whole body swelling  . Sulfa Antibiotics Other (See Comments)    Social History   Social History  . Marital status: Single    Spouse name: N/A  . Number of children: N/A  . Years of education: N/A   Occupational History  . Not on file.   Social History Main Topics  . Smoking status: Current Every Day Smoker    Packs/day: 0.25    Types: Cigarettes  . Smokeless tobacco: Never Used  . Alcohol use 0.0 - 60.0 oz/week  . Drug use: No  . Sexual activity: Yes   Other Topics Concern  . Not on file   Social History Narrative  . No narrative on file    Physical Exam: BP 108/66   Pulse 66   Temp 98.6 F  (37 C) (Oral)   Resp 18   Ht 5' (1.524 m)   Wt 158 lb (71.7 kg)   LMP 07/05/2016 (Approximate)   SpO2 100%   BMI 30.86 kg/m   Gen: NAD CV: RRR Pulm: CTAB Pelvic: 3/50/-3 Ext: no e/c/t  Consults: None  Significant Findings/ Diagnostic Studies:  Lab Results  Component Value Date   APPEARANCEUR CLEAR (A) 01/31/2017   GLUCOSEU NEGATIVE 01/31/2017   BILIRUBINUR NEGATIVE 01/31/2017   KETONESUR NEGATIVE 01/31/2017   LABSPEC 1.016 01/31/2017   HGBUR NEGATIVE 01/31/2017   PHURINE 6.0 01/31/2017   NITRITE NEGATIVE 01/31/2017   LEUKOCYTESUR NEGATIVE 01/31/2017   RBCU NONE SEEN 01/31/2017   WBCU 0-5 01/31/2017   BACTERIA NONE SEEN 01/31/2017   EPIU 0-5 (A) 01/31/2017  MUCOUSUACOMP PRESENT 01/31/2017   CAOXCRYSTALS PRESENT 05/11/2016     Procedures: NST Fetus A Baseline FHR: 135 beats/min Variability: moderate Accelerations: present (10x10) Decelerations: absent  Fetus B Baseline FHR: 135 beats/min (easily distinct from baby A) Variability: moderate Accelerations: present (10 x 10) Decelerations: absent  Tocometry: occasional ctx. infrequent  Discharge Condition: stable  Disposition: [facility] Not Defined  Diet: Regular diet  Discharge Activity: Activity as tolerated   Allergies as of 01/31/2017      Reactions   Magnesium Sulfate Swelling   Whole body swelling   Sulfa Antibiotics Other (See Comments)      Medication List    TAKE these medications   albuterol (2.5 MG/3ML) 0.083% nebulizer solution Commonly known as:  PROVENTIL Take 2.5 mg by nebulization every 6 (six) hours as needed for wheezing or shortness of breath.   aspirin EC 81 MG tablet Take 81 mg by mouth daily.   budesonide 32 MCG/ACT nasal spray Commonly known as:  RHINOCORT AQUA Place into the nose.   cholecalciferol 1000 units tablet Commonly known as:  VITAMIN D Take 1,000 Units by mouth daily.   prenatal multivitamin Tabs tablet Take 1 tablet by mouth  daily at 12 noon.      Total time spent taking care of this patient: 45 minutes  Signed: Thomasene Mohair, MD  01/31/2017, 10:39 PM

## 2017-01-31 NOTE — H&P (Signed)
OB History & Physical   History of Present Illness:  Chief Complaint: pelvic pressure  HPI:  Lauren Tapia is a 31 y.o. [phone] female at [redacted]w[redacted]d dated by 8 week ultrasound.  Her pregnancy has been complicated by mo/di twin gestation, IUGR of both twins, twin B with multicystic dysplastic kidney, history of preterm delivery x 2 with short interpregnancy interval with last delivery 04/2016. She further has had preterm labor this pregnancy with dilation 3 weeks ago to 1.5 cm. She was given betamethasone x 2 on 6/14 and 6/15.  He was transferred to [facility] where she was monitored and discharged without further labor changes and cervical exam of 1cm.  5 days ago she presented to [facility] L&D where she was having preterm labor and was dilated to 3 cm. She was transferred to [facility], where she again remained stable and was discharged on 01/28/17. She received another course of betamethasone at [facility] during that admission.  She presents today with pelvic pressure.  She thinks she lost her mucous plug last night, but has had no further discharge.  She has no vaginal symptoms of abnormal discharge, odor, itching or irritation.  She denies bladder symptoms. Denies GI symptoms. She has had no fever or chills.   She denies contractions.   She denies leakage of fluid.   She denies vaginal bleeding.   She reports fetal movement x 2 babies.    Last growth 01/02/2017: Fetus A: 612 grams (1 lb 6 oz) 3rd %ile Fetus B: 592 grams (1 lb 5 oz) <3rd %ile  Maternal Medical History:   Past Medical History:  Diagnosis Date  . Asthma   . GAD (generalized anxiety disorder)   . Obsessive compulsive disorder   . OCD (obsessive compulsive disorder)   . OSA on CPAP   . PCOS (polycystic ovarian syndrome)   . Pregnancy induced hypertension    with first pregnancy   . PTSD (post-traumatic stress disorder)     Past Surgical History:  Procedure Laterality Date  . NO PAST SURGERIES    .  TOOTH EXTRACTION      Allergies  Allergen Reactions  . Magnesium Sulfate Swelling    Whole body swelling  . Sulfa Antibiotics Other (See Comments)    Prior to Admission medications   Medication Sig Start Date End Date Taking? Authorizing Provider  aspirin EC 81 MG tablet Take 81 mg by mouth daily.   Yes [provider]  cholecalciferol (VITAMIN D) 1000 units tablet Take 1,000 Units by mouth daily.   Yes [provider]  Prenatal Vit-Fe Fumarate-FA (PRENATAL MULTIVITAMIN) TABS tablet Take 1 tablet by mouth daily at 12 noon.   Yes [provider]  albuterol (PROVENTIL) (2.5 MG/3ML) 0.083% nebulizer solution Take 2.5 mg by nebulization every 6 (six) hours as needed for wheezing or shortness of breath.    [provider]  budesonide (RHINOCORT AQUA) 32 MCG/ACT nasal spray Place into the nose.    [provider]    OB History  Gravida Para Term Preterm AB Living  4 3 1 2   3   SAB TAB Ectopic Multiple Live Births        0 3    # Outcome Date GA Lbr Len/2nd Weight Sex Delivery Anes PTL Lv  4 Current           3 Preterm 05/12/16 [redacted]w[redacted]d  4 lb 13.3 oz (2.19 kg) F Vag-Spont None  LIV     Birth Comments:  no anomalies or abnormal findings noted at delivery  2 Term 01/22/09 [redacted]w[redacted]d  4 lb 7 oz (2.013 kg) M Vag-Spont   LIV     Birth Comments: anus imperforated per notes  1 Preterm 02/07/04 [redacted]w[redacted]d  4 lb 2 oz (1.871 kg) F Vag-Spont   LIV     Complications: Toxemia in pregnancy      Prenatal care site: [facility]  Social History: She  reports that she has been smoking Cigarettes.  She has been smoking about 0.25 packs per day. She has never used smokeless tobacco. She reports that she does not drink alcohol or use drugs.  Family History: family history includes Asthma in her mother; Diabetes in her maternal aunt; Drug abuse in her maternal aunt, maternal uncle, and mother; Early death in her mother; Heart disease in her maternal grandmother;  Hyperlipidemia in her maternal grandmother; Hypertension in her maternal aunt and maternal grandmother; Stroke in her maternal grandmother.   Review of Systems:  Review of Systems  Constitutional: Negative.   HENT: Negative.   Eyes: Negative.   Respiratory: Negative.   Cardiovascular: Negative.   Gastrointestinal: Negative.   Genitourinary: Negative.   Musculoskeletal: Negative.   Skin: Negative.   Neurological: Negative.   Psychiatric/Behavioral: Negative.       Physical Exam:  Vital Signs: LMP 07/05/2016 (Approximate)  Physical Exam  Constitutional: She is oriented to person, place, and time. She appears well-developed and well-nourished. No distress.  HENT:  Head: Normocephalic and atraumatic.  Eyes: Conjunctivae are normal.  Neck: Normal range of motion. Neck supple.  Cardiovascular: Normal rate, regular rhythm and normal heart sounds.  Exam reveals no gallop and no friction rub.   No murmur heard. Pulmonary/Chest: Effort normal and breath sounds normal. She has no wheezes.  Abdominal: Soft. She exhibits no distension. There is no tenderness. Hernia confirmed negative in the right inguinal area and confirmed negative in the left inguinal area.  Gravid NT  Genitourinary: Pelvic exam was performed with patient supine. There is no rash, tenderness or lesion on the right labia. There is no rash, tenderness or lesion on the left labia.  Genitourinary Comments: NEFG, CVX: 3/50/-3/posterior  Musculoskeletal: Normal range of motion.  Lymphadenopathy:       Right: No inguinal adenopathy present.       Left: No inguinal adenopathy present.  Neurological: She is alert and oriented to person, place, and time.  Skin: Skin is warm and dry. No rash noted.  Psychiatric: She has a normal mood and affect. Her behavior is normal. Judgment normal.   Female chaperone present during pelvic exam   Pertinent Results:  Prenatal Labs: Blood type/Rh O positive  Antibody screen negative   Rubella Immune  RPR NR  HBsAg negative  HIV negative  GC negative  Chlamydia negative  Genetic screening 1st trim screen neg  1 hour GTT 120 on 6/26  3 hour GTT n/a  GBS negative on 01/09/17   NST: see Final progress note for fetal tracing assessments  Bedside Ultrasound:  Number of Fetus: 2  Presentation: cephalic/transverse  Fluid: subjectively normal  Assessment:  Evia Goldsmith is a 31 y.o. [phone] female at [redacted]w[redacted]d complicated by Mono/Di twin gestation, IUGR of both twins, twin B with multicystic dysplastic kidney, history of preterm delivery x 2 with short interpregnancy interval with last delivery 04/2016. She further has had preterm labor this pregnancy with dilation 3 weeks ago to 1.5 cm.  No sign of preterm labor today.   Plan:  1. Admit to Labor & Delivery for observation 2. UA 3. GBS negative.   4. Fetwal well-being: reassuring x 2 fetuses   Thomasene Mohair, MD 01/31/2017 8:40 PM

## 2017-02-03 ENCOUNTER — Ambulatory Visit
Admission: RE | Admit: 2017-02-03 | Discharge: 2017-02-03 | Disposition: A | Discharge status: Home / Self Care | Payer: Medicaid Other | Source: Ambulatory Visit | Attending: Obstetrics and Gynecology | Admitting: Obstetrics and Gynecology

## 2017-02-03 DIAGNOSIS — O30033 Twin pregnancy, monochorionic/diamniotic, third trimester: Secondary | ICD-10-CM | POA: Insufficient documentation

## 2017-02-03 DIAGNOSIS — Z3A30 30 weeks gestation of pregnancy: Secondary | ICD-10-CM | POA: Insufficient documentation

## 2017-02-03 DIAGNOSIS — O30039 Twin pregnancy, monochorionic/diamniotic, unspecified trimester: Secondary | ICD-10-CM

## 2017-02-04 ENCOUNTER — Encounter: Payer: Medicaid Other | Admitting: Obstetrics and Gynecology

## 2017-02-05 NOTE — Discharge Summary (Signed)
Physician Discharge Summary  Patient ID: Lauren Tapia MRN: [id number] DOB/AGE: 1986-07-02 31 y.o.  Admit date: 01/26/2017 Admitting provider: Conard Novak, MD Discharge date: 02/05/2017   Admission Diagnoses:  1) intrauterine pregnancy at [redacted]w[redacted]d  2) mo/di twins 3) history of preterm delivery 4) IUGR both twins 5) history of preterm delivery in two prior pregnancies   Discharge Diagnoses:  1) intrauterine pregnancy at [redacted]w[redacted]d  2) mo/di twins 3) history of preterm delivery 4) IUGR both twins 5) history of preterm delivery in two prior pregnancies 6) preterm delivery in current pregnancy, not delivered.  History of Present Illness: The patient is a 31 y.o. female [phone] at [redacted]w[redacted]d who presents for contractions. Denied vaginal bleeding and leaking of fluid. Both twins with active movement.   Hospital Course: cervix dilated to 3cm. Patient transferred to [facility].  Both twins with reassuring tracing prior to transfer. She was started on magnesium sulfate prior to transfer.  Rescue dose of steroids not given based on my conversation with MFM attending at [facility] They will decide at [facility], if needed.   Past Medical History:  Diagnosis Date  . Asthma   . GAD (generalized anxiety disorder)   . Obsessive compulsive disorder   . OCD (obsessive compulsive disorder)   . OSA on CPAP   . PCOS (polycystic ovarian syndrome)   . Pregnancy induced hypertension    with first pregnancy   . PTSD (post-traumatic stress disorder)     Past Surgical History:  Procedure Laterality Date  . NO PAST SURGERIES    . TOOTH EXTRACTION      No current facility-administered medications on file prior to encounter.    Current Outpatient Prescriptions on File Prior to Encounter  Medication Sig Dispense Refill  . aspirin EC 81 MG tablet Take 81 mg by mouth daily.    . cholecalciferol (VITAMIN D) 1000 units tablet Take 1,000 Units by mouth daily.    . Prenatal Vit-Fe Fumarate-FA (PRENATAL  MULTIVITAMIN) TABS tablet Take 1 tablet by mouth daily at 12 noon.    Marland Kitchen albuterol (PROVENTIL) (2.5 MG/3ML) 0.083% nebulizer solution Take 2.5 mg by nebulization every 6 (six) hours as needed for wheezing or shortness of breath.    . budesonide (RHINOCORT AQUA) 32 MCG/ACT nasal spray Place into the nose.      Allergies  Allergen Reactions  . Magnesium Sulfate Swelling    Whole body swelling  . Sulfa Antibiotics Other (See Comments)    Social History   Social History  . Marital status: Single    Spouse name: N/A  . Number of children: N/A  . Years of education: N/A   Occupational History  . Not on file.   Social History Main Topics  . Smoking status: Current Every Day Smoker    Packs/day: 0.25    Types: Cigarettes  . Smokeless tobacco: Never Used  . Alcohol use 0.0 - 60.0 oz/week  . Drug use: No  . Sexual activity: Yes   Other Topics Concern  . Not on file   Social History Narrative  . No narrative on file    Physical Exam: BP (!) 103/54 (BP Location: Left Arm)   Pulse 68   Temp 98.6 F (37 C) (Oral)   Resp 18   Ht 5' (1.524 m)   Wt 157 lb (71.2 kg)   LMP 07/05/2016 (Approximate)   BMI 30.66 kg/m   Gen: NAD CV: RRR Pulm: CTAB Pelvic: 3/50/-3 (just before departure for [facility] - stable exam)  (  female chaperone present) Ext: no e/c/t  Consults: None  Significant Findings/ Diagnostic Studies: None  Procedures: NST for both twins FHT A: 140, moderate variability, +accels (15x15), no decels FHT B: 140, moderate variability, +accels, no decels  Toco:  2-3 contractions q 10 mins  Discharge Condition: stable  Disposition: Transfer to [facility] via EMS  Diet: NPO diet  Discharge Activity: Bedrest   Allergies as of 01/26/2017      Reactions   Magnesium Sulfate Swelling   Whole body swelling   Sulfa Antibiotics Other (See Comments)      Medication List    ASK your doctor about these medications   albuterol (2.5 MG/3ML) 0.083% nebulizer  solution Commonly known as:  PROVENTIL Take 2.5 mg by nebulization every 6 (six) hours as needed for wheezing or shortness of breath.   aspirin EC 81 MG tablet Take 81 mg by mouth daily.   budesonide 32 MCG/ACT nasal spray Commonly known as:  RHINOCORT AQUA Place into the nose.   cholecalciferol 1000 units tablet Commonly known as:  VITAMIN D Take 1,000 Units by mouth daily.   prenatal multivitamin Tabs tablet Take 1 tablet by mouth daily at 12 noon.        Total time spent taking care of this patient: 45 minutes  Signed: Thomasene Mohair, MD  02/05/2017, 5:31 PM

## 2017-02-06 ENCOUNTER — Other Ambulatory Visit: Payer: Medicaid Other

## 2017-02-10 ENCOUNTER — Ambulatory Visit
Admission: RE | Admit: 2017-02-10 | Discharge: 2017-02-10 | Disposition: A | Discharge status: Home / Self Care | Payer: Non-veteran care | Source: Ambulatory Visit | Attending: Maternal & Fetal Medicine | Admitting: Maternal & Fetal Medicine

## 2017-02-10 ENCOUNTER — Other Ambulatory Visit: Payer: Self-pay | Admitting: *Deleted

## 2017-02-10 DIAGNOSIS — O365930 Maternal care for other known or suspected poor fetal growth, third trimester, not applicable or unspecified: Secondary | ICD-10-CM | POA: Insufficient documentation

## 2017-02-10 DIAGNOSIS — O30039 Twin pregnancy, monochorionic/diamniotic, unspecified trimester: Secondary | ICD-10-CM

## 2017-02-10 DIAGNOSIS — O30033 Twin pregnancy, monochorionic/diamniotic, third trimester: Secondary | ICD-10-CM

## 2017-02-10 DIAGNOSIS — Z3A30 30 weeks gestation of pregnancy: Secondary | ICD-10-CM | POA: Diagnosis not present

## 2017-02-10 DIAGNOSIS — O358XX2 Maternal care for other (suspected) fetal abnormality and damage, fetus 2: Secondary | ICD-10-CM | POA: Diagnosis not present

## 2017-02-12 ENCOUNTER — Encounter: Payer: Self-pay | Admitting: *Deleted

## 2017-02-12 ENCOUNTER — Observation Stay
Admission: EM | Admit: 2017-02-12 | Discharge: 2017-02-12 | Disposition: A | Discharge status: Home / Self Care | Payer: Non-veteran care | Attending: Obstetrics and Gynecology | Admitting: Obstetrics and Gynecology

## 2017-02-12 DIAGNOSIS — Z888 Allergy status to other drugs, medicaments and biological substances status: Secondary | ICD-10-CM | POA: Insufficient documentation

## 2017-02-12 DIAGNOSIS — Z9989 Dependence on other enabling machines and devices: Secondary | ICD-10-CM | POA: Insufficient documentation

## 2017-02-12 DIAGNOSIS — Z79899 Other long term (current) drug therapy: Secondary | ICD-10-CM | POA: Insufficient documentation

## 2017-02-12 DIAGNOSIS — Z3490 Encounter for supervision of normal pregnancy, unspecified, unspecified trimester: Secondary | ICD-10-CM | POA: Diagnosis present

## 2017-02-12 DIAGNOSIS — F429 Obsessive-compulsive disorder, unspecified: Secondary | ICD-10-CM | POA: Diagnosis not present

## 2017-02-12 DIAGNOSIS — F419 Anxiety disorder, unspecified: Secondary | ICD-10-CM | POA: Insufficient documentation

## 2017-02-12 DIAGNOSIS — O30003 Twin pregnancy, unspecified number of placenta and unspecified number of amniotic sacs, third trimester: Secondary | ICD-10-CM | POA: Insufficient documentation

## 2017-02-12 DIAGNOSIS — Z3A30 30 weeks gestation of pregnancy: Secondary | ICD-10-CM

## 2017-02-12 DIAGNOSIS — F17210 Nicotine dependence, cigarettes, uncomplicated: Secondary | ICD-10-CM | POA: Insufficient documentation

## 2017-02-12 DIAGNOSIS — Z7982 Long term (current) use of aspirin: Secondary | ICD-10-CM | POA: Diagnosis not present

## 2017-02-12 DIAGNOSIS — F4310 Post-traumatic stress disorder, unspecified: Secondary | ICD-10-CM | POA: Insufficient documentation

## 2017-02-12 DIAGNOSIS — E282 Polycystic ovarian syndrome: Secondary | ICD-10-CM | POA: Insufficient documentation

## 2017-02-12 DIAGNOSIS — Z8751 Personal history of pre-term labor: Secondary | ICD-10-CM | POA: Insufficient documentation

## 2017-02-12 DIAGNOSIS — G4733 Obstructive sleep apnea (adult) (pediatric): Secondary | ICD-10-CM | POA: Diagnosis not present

## 2017-02-12 DIAGNOSIS — Z882 Allergy status to sulfonamides status: Secondary | ICD-10-CM | POA: Diagnosis not present

## 2017-02-12 LAB — URINALYSIS, ROUTINE W REFLEX MICROSCOPIC
Bacteria, UA: NONE SEEN
Bilirubin Urine: NEGATIVE
GLUCOSE, UA: NEGATIVE mg/dL
HGB URINE DIPSTICK: NEGATIVE
KETONES UR: 5 mg/dL — AB
NITRITE: NEGATIVE
PH: 7 (ref 5.0–8.0)
PROTEIN: NEGATIVE mg/dL
Specific Gravity, Urine: 1.01 (ref 1.005–1.030)

## 2017-02-12 NOTE — Discharge Summary (Signed)
See final progress note. 

## 2017-02-12 NOTE — Progress Notes (Signed)
To bedside.  Off monitor getting dressed. States that she feels fine and needs to go home to be with her children.  Explained that she is having some discernable contractions.  She states they are mild and needs to leave.  Dr. Jean Rosenthal notified.  Would like her cervix checked. States that she is dressed and needs to get to her children.

## 2017-02-12 NOTE — Final Progress Note (Signed)
Physician Final Progress Note  Patient ID: Lauren Tapia MRN: [id number] DOB/AGE: Jul 28, 1986 31 y.o.  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 female [phone] at [redacted]w[redacted]d who presents for new-onset right lower quadrant abdominal pain. This pain started last night.  It is just below and to the right of her umbilicus.  It is like a "charlie horse" type of pain.  Nothing seems to make it better or worse. She rates the pain 8/10.  It does not radiate.  There are no associated symptoms.  She denies vaginal bleeding, leakage of fluid, fevers, chills, nausea, vomiting, diarrhea, constipation, urinary symptoms, vaginal symptoms.  She notes +FM x 2.   Hospital Course: patient observed. Cervical exam performed and unchanged from prior visits.  Benign exam.  Vitals normal.  Reassuring fetal antenatal testing.  The patient was initially admitted for a several hour observation.  However, she abruptly had to leave.  She states that she understands when to come back.  Preterm labor precautions given. She has an appointment for BPP with [facility] in the AM.  The patient was encouraged to make and keep routine prenatal appointments at [facility] as she has not been seen for a scheduled visit since 6/26.  Past Medical History:  Diagnosis Date  . Asthma    . GAD (generalized anxiety disorder)   . Obsessive compulsive disorder   . OCD (obsessive compulsive disorder)   . OSA on CPAP   . PCOS (polycystic ovarian syndrome)   . Pregnancy induced hypertension    with first pregnancy   . PTSD (post-traumatic stress disorder)     Past Surgical History:  Procedure Laterality Date  . NO PAST SURGERIES    . TOOTH EXTRACTION      No current facility-administered medications on file prior to encounter.    Current Outpatient Prescriptions on File Prior to Encounter  Medication Sig Dispense Refill  . albuterol (PROVENTIL) (2.5 MG/3ML) 0.083% nebulizer solution Take 2.5 mg by nebulization every 6 (six) hours as needed for wheezing or shortness of breath.    Marland Kitchen aspirin EC 81 MG tablet Take 81 mg by mouth daily.    . budesonide (RHINOCORT AQUA) 32 MCG/ACT nasal spray Place into the nose.    . cholecalciferol (VITAMIN D) 1000 units tablet Take 1,000 Units by mouth daily.    Marland Kitchen docusate sodium (COLACE) 100 MG capsule Take 100 mg by mouth daily.    . ferrous sulfate 325 (65 FE) MG EC tablet Take 325 mg by mouth daily with breakfast.    . Prenatal Vit-Fe Fumarate-FA (PRENATAL MULTIVITAMIN) TABS tablet Take 1 tablet by mouth daily at 12 noon.      Allergies  Allergen Reactions  . Magnesium Sulfate Swelling    Whole body swelling  . Sulfa Antibiotics Other (See Comments)    Social History   Social History  . Marital status: Single  Spouse name: N/A  . Number of children: N/A  . Years of education: N/A   Occupational History  . Not on file.   Social History Main Topics  . Smoking status: Current Every Day Smoker    Packs/day: 0.25    Types: Cigarettes  . Smokeless tobacco: Never Used  . Alcohol use 0.0 - 60.0 oz/week  . Drug use: No  . Sexual activity: Yes   Other Topics Concern  . Not on file   Social History Narrative  . No narrative on file    Physical Exam: BP 103/63 (BP Location: Left Arm)   Pulse 82   Temp 98.4 F (36.9  C) (Oral)   Resp 16   Ht 5' (1.524 m)   Wt 157 lb (71.2 kg)   LMP 07/05/2016 (Approximate)   BMI 30.66 kg/m   Gen: NAD CV: RRR Pulm: CTAB Abd: soft, nt to palpation, gravid.  Unable to provoke pain she felt previously. Pelvic: 3cm per RN Ext: no e/c/  Consults: None  Significant Findings/ Diagnostic Studies:  Lab Results  Component Value Date   APPEARANCEUR CLEAR (A) 02/12/2017   GLUCOSEU NEGATIVE 02/12/2017   BILIRUBINUR NEGATIVE 02/12/2017   KETONESUR 5 (A) 02/12/2017   LABSPEC 1.010 02/12/2017   HGBUR NEGATIVE 02/12/2017   PHURINE 7.0 02/12/2017   NITRITE NEGATIVE 02/12/2017   LEUKOCYTESUR TRACE (A) 02/12/2017   RBCU 0-5 02/12/2017   WBCU 0-5 02/12/2017   BACTERIA NONE SEEN 02/12/2017   EPIU 0-5 (A) 02/12/2017   MUCOUSUACOMP PRESENT 02/12/2017   CAOXCRYSTALS PRESENT 05/11/2016     Procedures:  NST Fetus A Baseline FHR: 135 beats/min Variability: moderate Accelerations: present Decelerations: absent  Fetus A Baseline FHR: 145 beats/min Variability: moderate Accelerations: present Decelerations: absent  Tocometry: irritability initially. Later, contractions noted every 7-8 minutes.    Interpretation:  INDICATIONS: abdominal pain in pregnancy, third trimester RESULTS:  A NST procedure was performed with FHR monitoring and a normal baseline established, appropriate time of 20-40 minutes of evaluation, and accels >2 seen w 15x15 characteristics.  Results show a REACTIVE NST.    Discharge Condition: stable  Disposition: 01-Home or Self Care  Diet: Regular diet  Discharge Activity: Activity as tolerated   Allergies as of 02/12/2017      Reactions   Magnesium Sulfate Swelling   Whole body swelling   Sulfa Antibiotics Other (See Comments)      Medication List    STOP taking these medications   albuterol (2.5 MG/3ML) 0.083% nebulizer solution Commonly known as:  PROVENTIL     TAKE these medications   aspirin EC 81 MG tablet Take 81 mg by mouth  daily.   budesonide 32 MCG/ACT nasal spray Commonly known as:  RHINOCORT AQUA Place into the nose.   cholecalciferol 1000 units tablet Commonly known as:  VITAMIN D Take 1,000 Units by mouth daily.   docusate sodium 100 MG capsule Commonly known as:  COLACE Take 100 mg by mouth daily.   ferrous sulfate 325 (65 FE) MG EC tablet Take 325 mg by mouth daily with breakfast.   prenatal multivitamin Tabs tablet Take 1 tablet by mouth daily at 12 noon.      Follow-up Information    [facility] Follow up on 02/13/2017.   Specialty:  Perinatology Contact information: [address] [place] [address] [phone]          Total time spent taking care of this patient: 30 minutes  Signed: Jeannett Senior  Jean Rosenthal, MD  02/12/2017, 4:38 PM

## 2017-02-12 NOTE — OB Triage Note (Signed)
Recvd from ED per wheelchair with c/o abdominal pain.  Changed to gown and to bed.  EFM applied.  Oriented to room and plan of care discussed.  Verbalized understanding and agrees with plan.

## 2017-02-13 ENCOUNTER — Other Ambulatory Visit: Payer: Medicaid Other

## 2017-02-13 ENCOUNTER — Ambulatory Visit
Admission: RE | Admit: 2017-02-13 | Discharge: 2017-02-13 | Disposition: A | Discharge status: Home / Self Care | Payer: Non-veteran care | Source: Ambulatory Visit | Attending: Obstetrics & Gynecology | Admitting: Obstetrics & Gynecology

## 2017-02-13 ENCOUNTER — Other Ambulatory Visit: Payer: Self-pay | Admitting: *Deleted

## 2017-02-13 DIAGNOSIS — Z3A30 30 weeks gestation of pregnancy: Secondary | ICD-10-CM | POA: Diagnosis not present

## 2017-02-13 DIAGNOSIS — O30033 Twin pregnancy, monochorionic/diamniotic, third trimester: Secondary | ICD-10-CM | POA: Diagnosis present

## 2017-02-13 DIAGNOSIS — O30039 Twin pregnancy, monochorionic/diamniotic, unspecified trimester: Secondary | ICD-10-CM

## 2017-02-13 DIAGNOSIS — O365990 Maternal care for other known or suspected poor fetal growth, unspecified trimester, not applicable or unspecified: Secondary | ICD-10-CM

## 2017-02-17 ENCOUNTER — Other Ambulatory Visit: Payer: Self-pay | Admitting: *Deleted

## 2017-02-17 ENCOUNTER — Inpatient Hospital Stay
Admission: EM | Admit: 2017-02-17 | Discharge: 2017-02-19 | DRG: 776 | Disposition: A | Discharge status: Home / Self Care | Payer: Medicaid Other | Attending: Advanced Practice Midwife | Admitting: Advanced Practice Midwife

## 2017-02-17 ENCOUNTER — Ambulatory Visit
Admission: RE | Admit: 2017-02-17 | Discharge: 2017-02-17 | Disposition: A | Discharge status: Home / Self Care | Payer: Non-veteran care | Source: Ambulatory Visit | Attending: Maternal & Fetal Medicine | Admitting: Maternal & Fetal Medicine

## 2017-02-17 DIAGNOSIS — O99335 Smoking (tobacco) complicating the puerperium: Secondary | ICD-10-CM | POA: Diagnosis present

## 2017-02-17 DIAGNOSIS — O30039 Twin pregnancy, monochorionic/diamniotic, unspecified trimester: Secondary | ICD-10-CM

## 2017-02-17 DIAGNOSIS — O365932 Maternal care for other known or suspected poor fetal growth, third trimester, fetus 2: Secondary | ICD-10-CM | POA: Diagnosis not present

## 2017-02-17 DIAGNOSIS — Z7982 Long term (current) use of aspirin: Secondary | ICD-10-CM

## 2017-02-17 DIAGNOSIS — O30033 Twin pregnancy, monochorionic/diamniotic, third trimester: Secondary | ICD-10-CM | POA: Diagnosis present

## 2017-02-17 DIAGNOSIS — O358XX0 Maternal care for other (suspected) fetal abnormality and damage, not applicable or unspecified: Secondary | ICD-10-CM | POA: Insufficient documentation

## 2017-02-17 DIAGNOSIS — Z3A31 31 weeks gestation of pregnancy: Secondary | ICD-10-CM | POA: Diagnosis not present

## 2017-02-17 DIAGNOSIS — F17210 Nicotine dependence, cigarettes, uncomplicated: Secondary | ICD-10-CM | POA: Diagnosis present

## 2017-02-17 DIAGNOSIS — O365931 Maternal care for other known or suspected poor fetal growth, third trimester, fetus 1: Secondary | ICD-10-CM | POA: Insufficient documentation

## 2017-02-17 DIAGNOSIS — Q614 Renal dysplasia: Secondary | ICD-10-CM | POA: Insufficient documentation

## 2017-02-17 LAB — CBC
HCT: 32.8 % — ABNORMAL LOW (ref 35.0–47.0)
HEMOGLOBIN: 11.4 g/dL — AB (ref 12.0–16.0)
MCH: 34.4 pg — AB (ref 26.0–34.0)
MCHC: 34.6 g/dL (ref 32.0–36.0)
MCV: 99.3 fL (ref 80.0–100.0)
PLATELETS: 242 10*3/uL (ref 150–440)
RBC: 3.31 MIL/uL — ABNORMAL LOW (ref 3.80–5.20)
RDW: 14.1 % (ref 11.5–14.5)
WBC: 9.5 10*3/uL (ref 3.6–11.0)

## 2017-02-17 LAB — TYPE AND SCREEN
ABO/RH(D): O POS
Antibody Screen: NEGATIVE

## 2017-02-17 MED ORDER — ONDANSETRON HCL 4 MG/2ML IJ SOLN: 4.0000 mg | INTRAMUSCULAR | Status: DC | PRN

## 2017-02-17 MED ORDER — OXYTOCIN 40 UNITS IN LACTATED RINGERS INFUSION - SIMPLE MED: 2.5000 [IU]/h | INTRAVENOUS | Status: DC

## 2017-02-17 MED ORDER — IBUPROFEN 600 MG PO TABS
600.0000 mg | ORAL_TABLET | Freq: Four times a day (QID) | ORAL | Status: DC
  Administered 2017-02-17 – 2017-02-19 (×6): 600 mg via ORAL
  Filled 2017-02-17 (×7): qty 1

## 2017-02-17 MED ORDER — SIMETHICONE 80 MG PO CHEW: 80.0000 mg | CHEWABLE_TABLET | ORAL | Status: DC | PRN

## 2017-02-17 MED ORDER — SENNOSIDES-DOCUSATE SODIUM 8.6-50 MG PO TABS
2.0000 | ORAL_TABLET | ORAL | Status: DC
  Administered 2017-02-17: 2 via ORAL
  Filled 2017-02-17: qty 2

## 2017-02-17 MED ORDER — DIPHENHYDRAMINE HCL 25 MG PO CAPS: 25.0000 mg | ORAL_CAPSULE | Freq: Four times a day (QID) | ORAL | Status: DC | PRN

## 2017-02-17 MED ORDER — ONDANSETRON HCL 4 MG PO TABS: 4.0000 mg | ORAL_TABLET | ORAL | Status: DC | PRN

## 2017-02-17 MED ORDER — COCONUT OIL OIL: 1.0000 "application " | TOPICAL_OIL | Status: DC | PRN

## 2017-02-17 MED ORDER — DIBUCAINE 1 % RE OINT: 1.0000 "application " | TOPICAL_OINTMENT | RECTAL | Status: DC | PRN

## 2017-02-17 MED ORDER — WITCH HAZEL-GLYCERIN EX PADS: 1.0000 "application " | MEDICATED_PAD | CUTANEOUS | Status: DC | PRN

## 2017-02-17 MED ORDER — TETANUS-DIPHTH-ACELL PERTUSSIS 5-2.5-18.5 LF-MCG/0.5 IM SUSP: 0.5000 mL | Freq: Once | INTRAMUSCULAR | Status: DC

## 2017-02-17 MED ORDER — ACETAMINOPHEN 325 MG PO TABS: 650.0000 mg | ORAL_TABLET | ORAL | Status: DC | PRN

## 2017-02-17 MED ORDER — LACTATED RINGERS IV SOLN: INTRAVENOUS | Status: DC

## 2017-02-17 MED ORDER — LACTATED RINGERS IV SOLN: 500.0000 mL | INTRAVENOUS | Status: DC | PRN

## 2017-02-17 MED ORDER — PRENATAL MULTIVITAMIN CH
1.0000 | ORAL_TABLET | Freq: Every day | ORAL | Status: DC
  Administered 2017-02-18 – 2017-02-19 (×2): 1 via ORAL

## 2017-02-17 MED ORDER — OXYTOCIN BOLUS FROM INFUSION: 500.0000 mL | Freq: Once | INTRAVENOUS | Status: DC

## 2017-02-17 MED ORDER — BENZOCAINE-MENTHOL 20-0.5 % EX AERO: 1.0000 "application " | INHALATION_SPRAY | CUTANEOUS | Status: DC | PRN

## 2017-02-17 NOTE — Discharge Instructions (Signed)
Preterm Birth °Preterm birth is a birth that happens before 37 weeks of pregnancy. Most pregnancies last about 39-41 weeks. Every week in the womb is important and is beneficial to the health of the infant. Infants born before 37 weeks of pregnancy are at a higher risk for complications. Depending on when the infant was born, he or she may be: °· Late preterm. Born between 32 weeks and 37 weeks of pregnancy. °· Very preterm. Born at less than 32 weeks of pregnancy. °· Extremely preterm. Born at less than 25 weeks of pregnancy. °The earlier a baby is born, the more likely the child will have issues related to prematurity. Complications and problems that can be seen in infants born too early include: °· Problems breathing (respiratory distress syndrome). °· Low birth weight. °· Problems feeding. °· Sleeping problems. °· Yellowing of the skin (jaundice). °· Infections such as pneumonia. °Babies born very preterm or extremely preterm are at risk for more serious medical issues. These include: °· More severe breathing issues. °· Eyesight issues. °· Brain development issues (intraventricular hemorrhage). °· Behavioral and emotional development issues. °· Growth and developmental delays. °· Cerebral palsy. °· Serious feeding or bowel complications (necrotizing enterocolitis). °What are the causes? °There are two broad categories of preterm birth. °· Spontaneous preterm birth. This is a birth resulting from preterm labor (not medically induced) or preterm premature rupture of membranes (PPROM). °· Indicated preterm birth. This is a birth resulting from labor being medically induced due to health, personal, or social reasons. °What increases the risk? °Preterm birth may be related to certain medical conditions, lifestyle factors, or demographic factors encountered by the mother or fetus. °· Medical conditions include: °¨ Multiple gestations (twins, triplets, and so on). °¨ Infection. °¨ Diabetes. °¨ Heart disease. °¨ Kidney  disease. °¨ Cervical or uterine abnormalities. °¨ Being underweight. °¨ High blood pressure or preeclampsia. °¨ Premature rupture of membranes (PROM). °¨ Birth defects in the fetus. °· Lifestyle factors include: °¨ Poor prenatal care. °¨ Poor nutrition or anemia. °¨ Cigarette smoking. °¨ Consuming alcohol. °¨ High levels of stress and lack of social or emotional support. °¨ Exposure to chemical or environmental toxins. °¨ Substance abuse. °· Demographic factors include: °¨ African-American ethnicity. °¨ Age (younger than 18 or older than 33 years of age). °¨ Low socioeconomic status. °Women with a history of preterm labor or who become pregnant within 18 months of giving birth are also at increased risk for preterm birth. °How is this diagnosed? °Your health care provider may request additional tests to diagnose underlying complications resulting from preterm birth. Tests on the infant may include: °· Physical exam. °· Blood tests. °· Chest X-rays. °· Heart-lung monitoring. °How is this treated? °After birth, special care will be taken to assess any problems or complications for the infant. Supportive care will be provided for the infant. Treatment depends on what problems are present and any complications that develop. Some preterm infants are cared for in a neonatal intensive care unit. In general, care may include: °· Maintaining temperature and oxygen in a clear heated box (baby isolette). °· Monitoring the infant's heart rate, breathing, and level of oxygen in the blood. °· Monitoring for signs of infection and, if needed, giving IV antibiotic medicine. °· Inserting a feeding tube (nose, mouth) or giving IV nutrition if unable to feed. °· Inserting a breathing tube (ventilation). °· Respiration support (continuous positive airway pressure [CPAP] or oxygen). °Treatment will change as the infant builds up strength and is able   to breathe and eat on his or her own. For some infants, no special treatment is  necessary. Parents may be educated on the potential health risks of prematurity to the infant. °Follow these instructions at home: °· Understand your infant's special conditions and needs. It may be reassuring to learn about infant CPR. °· Monitor your infant in the car seat until he or she grows and matures. Infant car seats can cause breathing difficulties for preterm infants. °· Keep your infant warm. Dress your infant in layers and keep him or her away from drafts, especially in cold months of the year. °· Wash your hands thoroughly after going to the bathroom or changing a diaper. Late preterm infants may be more prone to infection. °· Follow all your health care provider's instructions for providing support and care to your preterm infant. °· Get support from organizations and groups that understand your challenges. °· Follow up with your infant's health care provider as directed. °Prevention  °There are some things you can do to help lower your risk of having a preterm infant in the future. These include: °· Good prenatal care throughout the entire pregnancy. See a health care provider regularly for advice and tests. °· Management of underlying medical conditions. °· Proper self-care and lifestyle changes. °· Proper diet and weight control. °· Watching for signs of various infections. °Where to find more information: °March of Dimes: www.marchofdimes.com °Prematurity.org: www.prematurity.org °Contact a health care provider if: °· Your infant has feeding difficulties. °· Your infant has sleeping difficulties. °· Your infant has breathing difficulties. °· Your infant's skin starts to look yellow. °· Your infant shows signs of infection, such as a stuffy nose, fever, crying, or bluish color of the skin. °This information is not intended to replace advice given to you by your health care provider. Make sure you discuss any questions you have with your health care provider. °Document Released: 10/05/2003 Document  Revised: 12/21/2015 Document Reviewed: 02/11/2013 °Elsevier Interactive Patient Education © 2017 [employer] Inc. ° °

## 2017-02-17 NOTE — H&P (Signed)
OB History & Physical   History of Present Illness:  Chief Complaint: Preterm delivery of twins outside of hospital  HPI:  Lauren Tapia is a 31 y.o. [phone] female at [redacted]w[redacted]d dated by 8 week u/s.  Her pregnancy has been complicated by mo/di twin gestation, IUGR of both twins, twin B with multicystic dysplastic kidney, history of preterm delivery times 2, with short interpregnancy interval with last delivery 04/2016. She has had threatened preterm labor x2 with this pregnancy and received 2 courses of betamethasone. .    She is here today following delivery of both twins. The first twin was born this afternoon while she was at a clinic appointment for her older daughter in [place]. Her contractions began around 3:30. Baby A was born vertex in the clinic bathroom with 2 clinic staff assisting. Per patient membranes intact until following delivery. Paramedics were on the way at the time. Baby B was born feet first en route just before arrival to the ER. The placenta was delivered in the ER spontaneous, intact with marginal insertion of both cords.   The patient was brought to labor and delivery for recovery. No lacerations noted. Fundus firm below U. Bleeding minimal.    Maternal Medical History:   Past Medical History:  Diagnosis Date  . Asthma   . GAD (generalized anxiety disorder)   . Obsessive compulsive disorder   . OCD (obsessive compulsive disorder)   . OSA on CPAP   . PCOS (polycystic ovarian syndrome)   . Pregnancy induced hypertension    with first pregnancy   . PTSD (post-traumatic stress disorder)     Past Surgical History:  Procedure Laterality Date  . NO PAST SURGERIES    . TOOTH EXTRACTION      Allergies  Allergen Reactions  . Magnesium Sulfate Swelling    Whole body swelling  . Sulfa Antibiotics Other (See Comments)    Prior to Admission medications   Medication Sig Start Date End Date Taking? Authorizing Provider  aspirin EC 81 MG tablet Take 81 mg by mouth  daily.    [provider]  budesonide (RHINOCORT AQUA) 32 MCG/ACT nasal spray Place into the nose.    [provider]  cholecalciferol (VITAMIN D) 1000 units tablet Take 1,000 Units by mouth daily.    [provider]  docusate sodium (COLACE) 100 MG capsule Take 100 mg by mouth daily.    [provider]  ferrous sulfate 325 (65 FE) MG EC tablet Take 325 mg by mouth daily with breakfast.    [provider]  Prenatal Vit-Fe Fumarate-FA (PRENATAL MULTIVITAMIN) TABS tablet Take 1 tablet by mouth daily at 12 noon.    [provider]    OB History  Gravida Para Term Preterm AB Living  4 3 1 2   3   SAB TAB Ectopic Multiple Live Births        0 3    # Outcome Date GA Lbr Len/2nd Weight Sex Delivery Anes PTL Lv  4 Current           3 Preterm 05/12/16 [redacted]w[redacted]d  4 lb 13.3 oz (2.19 kg) F Vag-Spont None  LIV     Birth Comments: no anomalies or abnormal findings noted at delivery  2 Term 01/22/09 [redacted]w[redacted]d  4 lb 7 oz (2.013 kg) M Vag-Spont   LIV     Birth Comments: anus imperforated per notes  1 Preterm 02/07/04 [redacted]w[redacted]d  4 lb 2 oz (1.871 kg) F Vag-Spont   LIV  Complications: Toxemia in pregnancy      Prenatal care site: [facility]  Social History: She  reports that she has been smoking Cigarettes.  She has been smoking about 0.25 packs per day. She has never used smokeless tobacco. She reports that she does not drink alcohol. She reports that she does not use drugs.  Family History: family history includes Asthma in her mother; Diabetes in her maternal aunt; Drug abuse in her maternal aunt, maternal uncle, and mother; Early death in her mother; Heart disease in her maternal grandmother; Hyperlipidemia in her maternal grandmother; Hypertension in her maternal aunt and maternal grandmother; Stroke in her maternal grandmother.    Review of Systems: Negative x 10 systems reviewed except as noted in the HPI.    Physical Exam:  Vital Signs: BP  128/70   Pulse 78   Resp 16   Ht 5\' 6"  (1.676 m)   Wt 160 lb (72.6 kg)   LMP 07/05/2016 (Approximate)   SpO2 98%   BMI 25.82 kg/m  Constitutional: Well nourished, well developed female in no acute distress.  HEENT: normal Skin: Warm and dry.  Cardiovascular: Regular rate and rhythm.   Extremity: no edema  Respiratory: Clear to auscultation bilateral. Normal respiratory effort Abdomen: fundus firm below U Back: no CVAT Neuro: DTRs 2+, Cranial nerves grossly intact Psych: Alert and Oriented x3. No memory deficits. Normal mood and affect.  MS: normal gait, normal bilateral lower extremity ROM/strength/stability.  Pelvic exam: perineum intact, no lacerations   Pertinent Results:  Prenatal Labs: Blood type/Rh O positive  Antibody screen negative  Rubella Immune  Varicella Unknown    RPR Non-reactive  HBsAg negative  HIV negative  GC negative  Chlamydia negative  Genetic screening 1st trimester screen negative  1 hour GTT 120 on 6/26  3 hour GTT NA  GBS negative on 6/14     Assessment:  Lauren Tapia is a 31 y.o. 32 female at [redacted]w[redacted]d with preterm delivery of mo/di twins.   Plan:  1. Admit to Labor & Delivery for recovery 2. CBC, T&S, IVF 3. GBS negative.   4. Pitocin IV postpartum 5. Transfer to postpartum following recovery  [redacted]w[redacted]d, [facility] 02/17/2017 6:07 PM

## 2017-02-17 NOTE — ED Notes (Signed)
Mother noted to be on the bed using cell phone to call someone to get her 31 year old from the physicians office - child was left in the custody of PD per EMS - pt appears to have cord coming out of vagina when transferred to labor and delivery unit - she was A&O x4 - appeared in no acute distress - respirations even and unlabored - color race appropriate - pt c/o abd cramping and requesting pain medication

## 2017-02-17 NOTE — ED Notes (Signed)
Pt and Baby A and Baby B transferred to L&D

## 2017-02-17 NOTE — ED Provider Notes (Signed)
[facility] Emergency Department Provider Note  ____________________________________________   I have reviewed the triage vital signs and the nursing notes.   HISTORY  Chief Complaint Labor  History limited by: Not Limited   HPI Lauren Tapia is a 31 y.o. female who presents to the emergency department today with labor. The patient is [redacted] weeks pregnant with twins. The patient was at her daughter's doctor appointment today when she delivered the first when in the bathroom. She then delivered the second twin he had a breech delivery on the EMS truck. This is not the patient's fourth and fifth child. The patient states she is having some cramping in the lower abdomen.   Past Medical History:  Diagnosis Date  . Asthma   . GAD (generalized anxiety disorder)   . Obsessive compulsive disorder   . OCD (obsessive compulsive disorder)   . OSA on CPAP   . PCOS (polycystic ovarian syndrome)   . Pregnancy induced hypertension    with first pregnancy   . PTSD (post-traumatic stress disorder)     Patient Active Problem List   Diagnosis Date Noted  . Labor and delivery, indication for care 02/12/2017  . Preterm labor in second trimester 01/26/2017  . Abdominal pain affecting pregnancy 01/11/2017  . History of preterm delivery 01/09/2017  . Labor and delivery indication for care or intervention 01/09/2017  . Vitamin D deficiency 12/16/2016  . G6PD deficiency (HCC) 12/16/2016  . Depression 12/16/2016  . Pregnancy, supervision, high-risk, second trimester 10/14/2016  . Multiple gestation 10/07/2016  . Alcohol abuse   . OCD (obsessive compulsive disorder) 09/30/2016  . Asthma, mild intermittent 05/30/2015  . GAD (generalized anxiety disorder) 05/30/2015  . Obesity (BMI 30.0-34.9) 05/30/2015  . Tobacco abuse 05/30/2015    Past Surgical History:  Procedure Laterality Date  . NO PAST SURGERIES    . TOOTH EXTRACTION      Prior to Admission medications    Medication Sig Start Date End Date Taking? Authorizing Provider  aspirin EC 81 MG tablet Take 81 mg by mouth daily.    [provider]  budesonide (RHINOCORT AQUA) 32 MCG/ACT nasal spray Place into the nose.    [provider]  cholecalciferol (VITAMIN D) 1000 units tablet Take 1,000 Units by mouth daily.    [provider]  docusate sodium (COLACE) 100 MG capsule Take 100 mg by mouth daily.    [provider]  ferrous sulfate 325 (65 FE) MG EC tablet Take 325 mg by mouth daily with breakfast.    [provider]  Prenatal Vit-Fe Fumarate-FA (PRENATAL MULTIVITAMIN) TABS tablet Take 1 tablet by mouth daily at 12 noon.    [provider]    Allergies Magnesium sulfate and Sulfa antibiotics  Family History  Problem Relation Age of Onset  . Asthma Mother   . Drug abuse Mother   . Early death Mother   . Diabetes Maternal Aunt   . Hypertension Maternal Aunt   . Drug abuse Maternal Aunt   . Drug abuse Maternal Uncle   . Hypertension Maternal Grandmother   . Heart disease Maternal Grandmother   . Hyperlipidemia Maternal Grandmother   . Stroke Maternal Grandmother     Social History Social History  Substance Use Topics  . Smoking status: Current Every Day Smoker    Packs/day: 0.25    Types: Cigarettes  . Smokeless tobacco: Never Used  . Alcohol use 0.0 - 60.0 oz/week    Review of Systems Constitutional: No fever/chills  Cardiovascular: Denies chest pain. Respiratory: Denies shortness of breath. Gastrointestinal: Positive for abdominal cramping. Neurological: Negative for headaches, focal weakness or numbness.  ____________________________________________   PHYSICAL EXAM:  Constitutional: Alert and oriented.  Eyes: Conjunctivae are normal.  ENT   Head: Normocephalic and atraumatic.   Nose: No congestion/rhinnorhea.   Mouth/Throat: Mucous membranes are moist.   Neck: No stridor. Cardiovascular: Normal rate,  regular rhythm.   Respiratory: Normal respiratory effort without tachypnea nor retractions.  Gastrointestinal: Soft and slightly tender in the lower abdomin.  Genitourinary: Some bleeding with two umbilical cords present. Musculoskeletal: No obvious deformity. Neurologic:  Normal speech and language. No gross focal neurologic deficits are appreciated.  Skin:  Skin is warm, dry and intact. No rash noted. Psychiatric: Mood and affect are normal. Speech and behavior are normal. Patient exhibits appropriate insight and judgment.  ____________________________________________    LABS (pertinent positives/negatives)  None  ____________________________________________   EKG  None  ____________________________________________    RADIOLOGY  None  ____________________________________________   PROCEDURES  Procedures  ____________________________________________   INITIAL IMPRESSION / ASSESSMENT AND PLAN / ED COURSE  Pertinent labs & imaging results that were available during my care of the patient were reviewed by me and considered in my medical decision making (see chart for details).  Patient presented to the emergency department today because of concerns for labor. Patient has delivered infants prior to arrival to the emergency department. Patient herself had not yet delivered placenta however did deliver placenta shortly after arriving in the emergency department. The twins were assessed by neonatal team. Twin A with good color and respirations. Twin B however did require some assistance with secretion management. Did start getting better color and better respiratory effort after suction. They will be taken to the NICU.  ____________________________________________   FINAL CLINICAL IMPRESSION(S) / ED DIAGNOSES  Final diagnoses:  Premature delivery     Note: This dictation was prepared with Dragon dictation. Any transcriptional errors that result from this process are  unintentional     Phineas Semen, MD 02/17/17 1836

## 2017-02-17 NOTE — ED Notes (Addendum)
Baby A was delivered at physician office while mom was there with another child - baby was delivered on the toilet - Baby A has good color and minimal difficulty with respirations - blow by O2 with sats maintained between 88-95% - no accessory muscles being used - labor and delivery team in room when baby arrived

## 2017-02-17 NOTE — ED Notes (Signed)
Baby B was delivered as EMS pulled into the ER - Baby B still attached to cord/mother and being held between legs - labor/delivery team and delivery NP in room when mother and babies arrived - NP clamped cord and handed baby to the delivery team - Baby B color was poor and having respiratory difficulty - RT called to meet the delivery team upstairs - Baby B using accessory muscles to breath with retractions noted

## 2017-02-17 NOTE — ED Triage Notes (Signed)
Pt arrived via ems for c/o delivery - pt was at physician office with another child and went to the bathroom and delivered Baby A over the toilet - EMS was called and they delivered Baby B as they were turning into the ER - see blank notes on Baby A and Baby B - pt was [redacted] weeks pregnant

## 2017-02-18 ENCOUNTER — Encounter: Payer: Self-pay | Admitting: Advanced Practice Midwife

## 2017-02-18 DIAGNOSIS — O99335 Smoking (tobacco) complicating the puerperium: Secondary | ICD-10-CM

## 2017-02-18 DIAGNOSIS — F17210 Nicotine dependence, cigarettes, uncomplicated: Secondary | ICD-10-CM

## 2017-02-18 LAB — CBC
HCT: 33.4 % — ABNORMAL LOW (ref 35.0–47.0)
HEMOGLOBIN: 11.4 g/dL — AB (ref 12.0–16.0)
MCH: 33.4 pg (ref 26.0–34.0)
MCHC: 34.3 g/dL (ref 32.0–36.0)
MCV: 97.4 fL (ref 80.0–100.0)
PLATELETS: 231 10*3/uL (ref 150–440)
RBC: 3.42 MIL/uL — AB (ref 3.80–5.20)
RDW: 14.1 % (ref 11.5–14.5)
WBC: 11.9 10*3/uL — AB (ref 3.6–11.0)

## 2017-02-18 LAB — KLEIHAUER-BETKE STAIN
FETAL CELLS %: 0.2 %
Quantitation Fetal Hemoglobin: 0.0009 mL

## 2017-02-18 NOTE — Care Management Note (Signed)
Case Management Note  Patient Details  Name: Lauren Tapia MRN: [id number] Date of Birth: November 03, 1985  Subjective/Objective:   Delivered twins outside the hospital. Twin A Amir weighted in at 1320 grams (2.01 pounds). Twin B [name] weighted in at 1330 grams (2.93 pounds). [name] has other children ages 54,8 and 9 months. Supprt people are the father of the twins, family members, and neighbors. [name] has [employer] listed as primary insurance and medicaid as secondary. She did serve in the [employer].  States she may need assistance with car seats when the babies are ready to come home. Discussed that the babies still have to grow a lot before we would know what size seat they would need. [name] indicated that [name] that works at [employer] had said she may be able to assist with needs.                   Action/Plan:  Will continue to follow for follow-up needs. Also, will discuss with Clinical Social Worker   Expected Discharge Date:  02/19/17               Expected Discharge Plan:     In-House Referral:     Discharge planning Services     Post Acute Care Choice:    Choice offered to:     DME Arranged:    DME Agency:     HH Arranged:    HH Agency:     Status of Service:     If discussed at [employer], dates discussed:    Additional Comments:  Gwenette Greet, RN MSN CCM Care Management [phone] 02/18/2017, 1:23 PM

## 2017-02-18 NOTE — Lactation Note (Signed)
Lactation Consultation Note Mom spent all evening in SCN with twins.  When she returned to room, she had a stool softener and kept having to return to the bathroom.  I did get to meet with her long enough to discuss supply and demand and need to pump every 3 hours to bring in mature milk and ensure a plentiful milk supply for twins.  Discussed handling of breast milk, pumping, collection, labeling and storage of breast milk.  Explained normal course of lactation. Discussed hand expression, but when brought vials to demonstrate, she was asleep.  Lactation name and number written on white board and encouraged to call for questions, concerns or assistance. Patient Name: Lauren Tapia CHYIF'O Date: 02/18/2017     Maternal Data    Feeding    LATCH Score/Interventions                      Lactation Tools Discussed/Used     Consult Status      Lauren Tapia 02/18/2017, 11:33 PM

## 2017-02-18 NOTE — Lactation Note (Signed)
Lactation Consultation Note Mom reports being unable to breast feed first baby because never did lactate.  With last baby, she breast fed for 9 months until getting pregnant with twins. Patient Name: Lauren Tapia [id number] Date: 02/18/2017     Maternal Data    Feeding    LATCH Score/Interventions                      Lactation Tools Discussed/Used     Consult Status      [name] 02/18/2017, 11:43 PM

## 2017-02-18 NOTE — Progress Notes (Signed)
Post Partum Day 1 Subjective: up ad lib, voiding and tolerating regular diet. Lauren Tapia and Lauren Tapia in the NICU on room air.  Objective: Blood pressure 127/69, pulse 73, temperature 98.5 F (36.9 C), temperature source Oral, resp. rate 18, height 5\' 6"  (1.676 m), weight 72.6 kg (160 lb), last menstrual period 07/05/2016, SpO2 99 %, unknown if currently breastfeeding.  Physical Exam:  General: alert, cooperative and no distress Lochia: appropriate Uterine Fundus: firm/ at U/ ML/ NT  DVT Evaluation: No evidence of DVT seen on physical exam.   Recent Labs  02/17/17 1843 02/18/17 0527  HGB 11.4* 11.4*  HCT 32.8* 33.4*  WBC 9.5 11.9*  PLT 242 231    Assessment/Plan: stable ppd#1 Plan for discharge tomorrow O POS/ RI/ VI Breast/bottle Interval tubal vs Nexplanon TDAP-last dose in 2017, with prior pregnancy.    LOS: 1 day   2018 02/18/2017, 10:07 AM

## 2017-02-19 DIAGNOSIS — O99335 Smoking (tobacco) complicating the puerperium: Secondary | ICD-10-CM

## 2017-02-19 DIAGNOSIS — F17210 Nicotine dependence, cigarettes, uncomplicated: Secondary | ICD-10-CM

## 2017-02-19 LAB — RPR: RPR Ser Ql: NONREACTIVE

## 2017-02-19 MED ORDER — IBUPROFEN 600 MG PO TABS: 600.0000 mg | ORAL_TABLET | Freq: Four times a day (QID) | ORAL | 0 refills | Status: AC

## 2017-02-19 NOTE — Progress Notes (Signed)
Period of purple cry video watched by patient. Patient verbalized understanding and had no questions. Patient given a copy of video to take home with her.

## 2017-02-19 NOTE — Progress Notes (Signed)
Patient discharged home, infants remain in SCN. Discharge instructions, prescriptions and follow up appointment given to and reviewed with patient. Patient verbalized understanding. Escorted out via wheelchair by auxiliary.

## 2017-02-19 NOTE — Discharge Summary (Signed)
Obstetric Discharge Summary Reason for Admission: preciptious preterm delivery Prenatal Procedures: none Intrapartum Procedures: spontaneous vaginal delivery Postpartum Procedures: none Complications-Operative and Postpartum: none Hemoglobin  Date Value Ref Range Status  02/18/2017 11.4 (L) 12.0 - 16.0 g/dL Final  13/24/4010 9.6 (L) 11.1 - 15.9 g/dL Final  27/25/3664 40.3 g/dL Final   HCT  Date Value Ref Range Status  02/18/2017 33.4 (L) 35.0 - 47.0 % Final  09/05/2016 34 % Final   Hematocrit  Date Value Ref Range Status  01/21/2017 28.7 (L) 34.0 - 46.6 % Final    Physical Exam:  General: alert, appears stated age and no distress Lochia: appropriate Uterine Fundus: firm Incision: healing well DVT Evaluation: No evidence of DVT seen on physical exam.  Discharge Diagnoses: spontanous precipitous delivery of preterm twins  Discharge Information: Date: 02/19/2017 Activity: pelvic rest Diet: routine Allergies as of 02/19/2017      Reactions   Magnesium Sulfate Swelling   Whole body swelling   Sulfa Antibiotics Other (See Comments)      Medication List    STOP taking these medications   aspirin EC 81 MG tablet     TAKE these medications   budesonide 32 MCG/ACT nasal spray Commonly known as:  RHINOCORT AQUA Place into the nose.   cholecalciferol 1000 units tablet Commonly known as:  VITAMIN D Take 1,000 Units by mouth daily.   docusate sodium 100 MG capsule Commonly known as:  COLACE Take 100 mg by mouth daily.   ferrous sulfate 325 (65 FE) MG EC tablet Take 325 mg by mouth daily with breakfast.   ibuprofen 600 MG tablet Commonly known as:  ADVIL,MOTRIN Take 1 tablet (600 mg total) by mouth every 6 (six) hours.   prenatal multivitamin Tabs tablet Take 1 tablet by mouth daily at 12 noon.       Condition: stable Discharge to: home Follow-up Information    Tresea Mall, CNM. Schedule an appointment as soon as possible for a visit in 6 week(s).    Specialty:  Obstetrics Why:  postpartum follow up Contact information: [address] [place] [address] [phone]           Newborn Data:   Keyana, Guevara Boy A [[id number]]  Live born female  Birth Weight: 2 lb 14.6 oz (1320 g) APGAR: ,    Jacqueli, Pangallo [[id number]]  Live born female  Birth Weight: 2 lb 14.2 oz (1310 g) APGAR: ,   Home with mother.  Vena Austria 02/19/2017, 8:43 AM

## 2017-02-20 ENCOUNTER — Other Ambulatory Visit: Payer: Medicaid Other

## 2017-02-20 LAB — SURGICAL PATHOLOGY

## 2017-02-24 ENCOUNTER — Other Ambulatory Visit: Payer: Medicaid Other

## 2017-02-27 ENCOUNTER — Other Ambulatory Visit: Payer: Medicaid Other

## 2017-02-27 ENCOUNTER — Telehealth: Payer: Self-pay | Admitting: Obstetrics and Gynecology

## 2017-02-27 NOTE — Telephone Encounter (Signed)
Calling from [facility] for Patient home visit score 15. Advises pt should be seen more sooner than schedule date 04/01/17. HB#[phone]

## 2017-02-27 NOTE — Telephone Encounter (Signed)
Spoke with patient and she is schedule 03/03/17 with Dr. Jean Rosenthal

## 2017-03-03 ENCOUNTER — Telehealth: Payer: Self-pay | Admitting: Obstetrics and Gynecology

## 2017-03-03 ENCOUNTER — Ambulatory Visit: Payer: Medicaid Other

## 2017-03-03 ENCOUNTER — Ambulatory Visit (INDEPENDENT_AMBULATORY_CARE_PROVIDER_SITE_OTHER): Payer: Medicaid Other | Admitting: Obstetrics and Gynecology

## 2017-03-03 DIAGNOSIS — F324 Major depressive disorder, single episode, in partial remission: Secondary | ICD-10-CM

## 2017-03-03 NOTE — Telephone Encounter (Signed)
Pt coming 9/4 at 11:10 for nexplanon insert with ams

## 2017-03-03 NOTE — Progress Notes (Signed)
Obstetrics & Gynecology Office Visit   Chief Complaint  Patient presents with  . Follow-up    History of Present Illness: 31 y.o. [id number] who is 2 weeks postpartum after delivering twins outside of hospital at [redacted] weeks gestation. Was seen at home with elevated EPDS. No concerns today. Specifically, denies SI/HI.   Past Medical History:  Diagnosis Date  . Asthma   . GAD (generalized anxiety disorder)   . Obsessive compulsive disorder   . OCD (obsessive compulsive disorder)   . OSA on CPAP   . PCOS (polycystic ovarian syndrome)   . Pregnancy induced hypertension    with first pregnancy   . PTSD (post-traumatic stress disorder)     Past Surgical History:  Procedure Laterality Date  . NO PAST SURGERIES    . TOOTH EXTRACTION      Gynecologic History: No LMP recorded.  Obstetric History: [id number]  Family History  Problem Relation Age of Onset  . Asthma Mother   . Drug abuse Mother   . Early death Mother   . Diabetes Maternal Aunt   . Hypertension Maternal Aunt   . Drug abuse Maternal Aunt   . Drug abuse Maternal Uncle   . Hypertension Maternal Grandmother   . Heart disease Maternal Grandmother   . Hyperlipidemia Maternal Grandmother   . Stroke Maternal Grandmother     Social History   Social History  . Marital status: Single    Spouse name: N/A  . Number of children: N/A  . Years of education: N/A   Occupational History  . Not on file.   Social History Main Topics  . Smoking status: Current Every Day Smoker    Packs/day: 0.25    Types: Cigarettes  . Smokeless tobacco: Never Used  . Alcohol use 0.0 - 60.0 oz/week  . Drug use: No  . Sexual activity: Yes   Other Topics Concern  . Not on file   Social History Narrative  . No narrative on file    Allergies  Allergen Reactions  . Magnesium Sulfate Swelling    Whole body swelling  . Sulfa Antibiotics Other (See Comments)    Prior to Admission medications   Medication Sig Start Date End Date  Taking? Authorizing Provider  budesonide (RHINOCORT AQUA) 32 MCG/ACT nasal spray Place into the nose.    [provider]  cholecalciferol (VITAMIN D) 1000 units tablet Take 1,000 Units by mouth daily.    [provider]  docusate sodium (COLACE) 100 MG capsule Take 100 mg by mouth daily.    [provider]  ferrous sulfate 325 (65 FE) MG EC tablet Take 325 mg by mouth daily with breakfast.    [provider]  ibuprofen (ADVIL,MOTRIN) 600 MG tablet Take 1 tablet (600 mg total) by mouth every 6 (six) hours. Patient not taking: Reported on 03/03/2017 02/19/17   Vena Austria, MD  Prenatal Vit-Fe Fumarate-FA (PRENATAL MULTIVITAMIN) TABS tablet Take 1 tablet by mouth daily at 12 noon.    [provider]    Review of Systems  Constitutional: Negative.   HENT: Negative.   Eyes: Negative.   Respiratory: Negative.   Cardiovascular: Negative.   Gastrointestinal: Negative.   Genitourinary: Negative.   Musculoskeletal: Negative.   Skin: Negative.   Neurological: Negative.   Psychiatric/Behavioral: Negative.      Physical Exam BP 114/70   Ht 5' (1.524 m)   Wt 145 lb (65.8 kg)   BMI 28.32 kg/m  No LMP recorded. Physical Exam  Constitutional:  She appears well-developed and well-nourished.  HENT:  Head: Normocephalic and atraumatic.  Psychiatric: She has a normal mood and affect. Her behavior is normal. Judgment normal.   Assessment: 31 y.o. [id number] female 2 weeks postpartum from mono-di twin vaginal delivery outside of hospital.  Doing well overall from a mood standpoint.  Plan: Problem List Items Addressed This Visit    Depression   Postpartum care following vaginal delivery - Primary     Continue to monitor. F/u PRN for mood issues. She knows to go to ER for SI/HI. Nexplanon insertion a 6 week visit  Return in about 4 weeks (around 03/31/2017) for 6 wk postpartum and ADD Nexplanon insertion.  Thomasene Mohair, MD 03/03/2017 10:34 AM

## 2017-03-04 NOTE — Telephone Encounter (Signed)
Noted. Will order to arrive by apt date/time. 

## 2017-04-01 ENCOUNTER — Encounter: Payer: Self-pay | Admitting: Obstetrics and Gynecology

## 2017-04-01 ENCOUNTER — Ambulatory Visit (INDEPENDENT_AMBULATORY_CARE_PROVIDER_SITE_OTHER): Payer: Medicaid Other | Admitting: Obstetrics and Gynecology

## 2017-04-01 DIAGNOSIS — Z3049 Encounter for surveillance of other contraceptives: Secondary | ICD-10-CM | POA: Diagnosis not present

## 2017-04-01 DIAGNOSIS — F53 Postpartum depression: Secondary | ICD-10-CM

## 2017-04-01 DIAGNOSIS — O99345 Other mental disorders complicating the puerperium: Secondary | ICD-10-CM

## 2017-04-01 MED ORDER — BUSPIRONE HCL 10 MG PO TABS: 10.0000 mg | ORAL_TABLET | Freq: Two times a day (BID) | ORAL | 2 refills | Status: AC

## 2017-04-01 MED ORDER — SERTRALINE HCL 100 MG PO TABS: 100.0000 mg | ORAL_TABLET | Freq: Every day | ORAL | 2 refills | Status: AC

## 2017-04-01 NOTE — Progress Notes (Signed)
Postpartum Visit  Chief Complaint:  Chief Complaint  Patient presents with  . Postpartum Care  . Contraception    Nexplanon insert    History of Present Illness: Patient is a 31 y.o. [id number] presents for postpartum visit.  Review the Delivery Report for details.  Date of delivery: 02/17/2017 This patient has no babies on file. Type of delivery: Vaginal delivery - precipitous preterm twin delivery on route to hospital Episiotomy No.  Laceration: no  Pregnancy or labor problems:   Preterm twin delivery Any problems since the delivery:  Anxiety/depression  Newborn Details:  Maternal Details:  Breast Feeding:  no Post partum depression/anxiety noted:  yes Edinburgh Post-Partum Depression Score:  11  Date of last PAP: 10/26/2015 NIL HPV negative   Review of Systems: ROS  The following portions of the patient's history were reviewed and updated as appropriate: allergies, current medications, past family history, past medical history, past social history, past surgical history and problem list.  Past Medical History:  Past Medical History:  Diagnosis Date  . Asthma   . GAD (generalized anxiety disorder)   . Obsessive compulsive disorder   . OCD (obsessive compulsive disorder)   . OSA on CPAP   . PCOS (polycystic ovarian syndrome)   . Pregnancy induced hypertension    with first pregnancy   . PTSD (post-traumatic stress disorder)     Past Surgical History:  Past Surgical History:  Procedure Laterality Date  . NO PAST SURGERIES    . TOOTH EXTRACTION      Family History:  Family History  Problem Relation Age of Onset  . Asthma Mother   . Drug abuse Mother   . Early death Mother   . Diabetes Maternal Aunt   . Hypertension Maternal Aunt   . Drug abuse Maternal Aunt   . Drug abuse Maternal Uncle   . Hypertension Maternal Grandmother   . Heart disease Maternal Grandmother   . Hyperlipidemia Maternal Grandmother   . Stroke Maternal Grandmother     Social  History:  Social History   Social History  . Marital status: Single    Spouse name: N/A  . Number of children: N/A  . Years of education: N/A   Occupational History  . Not on file.   Social History Main Topics  . Smoking status: Current Every Day Smoker    Packs/day: 0.25    Types: Cigarettes  . Smokeless tobacco: Never Used  . Alcohol use 0.0 - 60.0 oz/week  . Drug use: No  . Sexual activity: Yes   Other Topics Concern  . Not on file   Social History Narrative  . No narrative on file    Allergies:  Allergies  Allergen Reactions  . Magnesium Sulfate Swelling    Whole body swelling  . Sulfa Antibiotics Other (See Comments)    Medications: Prior to Admission medications   Medication Sig Start Date End Date Taking? Authorizing Provider  budesonide (RHINOCORT AQUA) 32 MCG/ACT nasal spray Place into the nose.    [provider]  cholecalciferol (VITAMIN D) 1000 units tablet Take 1,000 Units by mouth daily.    [provider]  docusate sodium (COLACE) 100 MG capsule Take 100 mg by mouth daily.    [provider]  ferrous sulfate 325 (65 FE) MG EC tablet Take 325 mg by mouth daily with breakfast.    [provider]  ibuprofen (ADVIL,MOTRIN) 600 MG tablet Take 1 tablet (600 mg total) by mouth every 6 (  six) hours. Patient not taking: Reported on 03/03/2017 02/19/17   Vena Austria, MD  Prenatal Vit-Fe Fumarate-FA (PRENATAL MULTIVITAMIN) TABS tablet Take 1 tablet by mouth daily at 12 noon.    [provider]    Physical Exam Vitals:  Vitals:   04/01/17 1121  BP: 122/80  Pulse: 75    General: NAD HEENT: normocephalic, anicteric Pulmonary: No increased work of breathing Abdomen: NABS, soft, non-tender, non-distended.  Umbilicus without lesions.  No hepatomegaly, splenomegaly or masses palpable. No evidence of hernia. Extremities: no edema, erythema, or tenderness Neurologic: Grossly intact Psychiatric: mood appropriate,  affect full  GYNECOLOGY PROCEDURE NOTE  Patient is a 31 y.o. [id number] presenting for Nexplanon insertion as her desires means of contraception.  She provided informed consent, signed copy in the chart, time out was performed.   She understands that Nexplanon is a progesterone only therapy, and that patients often patients have irregular and unpredictable vaginal bleeding or amenorrhea. She understands that other side effects are possible related to systemic progesterone, including but not limited to, headaches, breast tenderness, nausea, and irritability. While effective at preventing pregnancy long acting reversible contraceptives do not prevent transmission of sexually transmitted diseases and use of barrier methods for this purpose was discussed. The placement procedure for Nexplanon was reviewed with the patient in detail including risks of nerve injury, infection, bleeding and injury to other muscles or tendons. She understands that the Nexplanon implant is good for 3 years and needs to be removed at the end of that time.  She understands that Nexplanon is an extremely effective option for contraception, with failure rate of <1%. This information is reviewed today and all questions were answered. Informed consent was obtained, both verbally and written.   The patient is healthy and has no contraindications to Implanon use. Urine pregnancy test was performed today and was negative.  Procedure Appropriate time out taken.  Patient placed in dorsal supine with left arm above head, elbow flexed at 90 degrees, arm resting on examination table.  The bicipital grove was palpated and site 8-10cm proximal to the medial epicondyle was indentified . The insertion site was prepped with a two betadine swabs and then injected with 3 cc of 1% lidocaine without epinephrine.  Nexplanon removed form sterile blister packaging,  Device confirmed in needle, before inserting full length of needle, tenting up the skin as the  needle was advance.  The drug eluting rod was then deployed by pulling back the slider per the manufactures recommendation.  The implant was palpable by the clinician as well as the patient.  The insertion site covered dressed with a band aid before applying  a kerlex bandage pressure dressing..Minimal blood loss was noted during the procedure.  The patientt tolerated the procedure well.   She was instructed to wear the bandage for 24 hours, call with any signs of infection.  She was given the Implanon card and instructed to have the rod removed in 3 years.   Assessment: 31 y.o. [id number] presenting for 6 week postpartum visit  Plan: Problem List Items Addressed This Visit      Other   Postpartum care following vaginal delivery - Primary       1) Contraception Education given regarding options for contraception, including nexplanon (placed at today's visit).  Patient opts for Nexplanon for contraception.  She understands that Nexplanon is a progesterone only therapy, and that patients often patients have irregular and unpredictable vaginal bleeding or amenorrhea. She understands that other side effects are  possible related to systemic progesterone, including but not limited to, headaches, breast tenderness, nausea, and irritability. While effective at preventing pregnancy long acting reversible contraceptives do not prevent transmission of sexually transmitted diseases and use of barrier methods for this purpose was discussed.  The placement procedure for Nexplanon was reviewed with the patient in detail including risks of nerve injury, infection, bleeding and injury to other muscles or tendons. She understands that the Nexplanon implant is good for 3 years and needs to be removed at the end of that time.  She understands that Nexplanon is an extremely effective option for contraception, with failure rate of <1%.   2)  Pap - ASCCP guidelines and rational discussed.  Patient opts for every 3 year  screening interval  3) Patient underwent screening for postpartum depression with concerns noted.  No SI/HI.  Already on max dose of Zoloft, will add Buspar.    4) Follow up 1 year for routine annual exam, 4-6 week follow up depression

## 2017-04-08 ENCOUNTER — Ambulatory Visit
Admission: EM | Admit: 2017-04-08 | Discharge: 2017-04-08 | Disposition: A | Discharge status: Home / Self Care | Payer: Non-veteran care | Attending: Family Medicine | Admitting: Family Medicine

## 2017-04-08 DIAGNOSIS — J0100 Acute maxillary sinusitis, unspecified: Secondary | ICD-10-CM | POA: Diagnosis not present

## 2017-04-08 MED ORDER — FEXOFENADINE-PSEUDOEPHED ER 180-240 MG PO TB24: 1.0000 | ORAL_TABLET | Freq: Every day | ORAL | 0 refills | Status: AC

## 2017-04-08 MED ORDER — AMOXICILLIN-POT CLAVULANATE 875-125 MG PO TABS: 1.0000 | ORAL_TABLET | Freq: Two times a day (BID) | ORAL | 0 refills | Status: AC

## 2017-04-08 MED ORDER — FLUTICASONE PROPIONATE 50 MCG/ACT NA SUSP: 2.0000 | Freq: Every day | NASAL | 0 refills | Status: AC

## 2017-04-08 NOTE — ED Provider Notes (Signed)
[facility]    CSN: [id number] Arrival date & time: 04/08/17  3557     History   Chief Complaint Chief Complaint  Patient presents with  . Nasal Congestion    HPI Lauren Tapia is a 31 y.o. female.   The patient is a 31 year old Afro-American female who has had recurrent nasal infection sinus infection and was told after having a CT scan of sinuses that one of her sinuses at least appears to be only partially developed. She states has not, to have at least one sinus infection a year. She reports this was started last week Monday about 9 days ago. She started having nasal congestion coughing pressure sinuses especially over her left maxillary sinus area. Unfortunately she does smoke. No previous surgeries or operations. She has a history of asthma generalized anxiety disorder polycystic ovarian syndrome. Was seen his hypertension and PTSD. No pertinent family medical history relevant today's visit. She is a gravida 4 para 4 and one ectopic. One set of twins. She is allergic to certain animals but no medications that she is aware of   The history is provided by the patient. No language interpreter was used.    Past Medical History:  Diagnosis Date  . Asthma   . GAD (generalized anxiety disorder)   . Obsessive compulsive disorder   . OCD (obsessive compulsive disorder)   . OSA on CPAP   . PCOS (polycystic ovarian syndrome)   . Pregnancy induced hypertension    with first pregnancy   . PTSD (post-traumatic stress disorder)     Patient Active Problem List   Diagnosis Date Noted  . Postpartum care following vaginal delivery 02/17/2017  . History of preterm delivery 01/09/2017  . Vitamin D deficiency 12/16/2016  . G6PD deficiency (HCC) 12/16/2016  . Depression 12/16/2016  . Alcohol abuse   . OCD (obsessive compulsive disorder) 09/30/2016  . Asthma, mild intermittent 05/30/2015  . GAD (generalized anxiety disorder) 05/30/2015  . Obesity (BMI 30.0-34.9) 05/30/2015   . Tobacco abuse 05/30/2015    Past Surgical History:  Procedure Laterality Date  . NO PAST SURGERIES    . TOOTH EXTRACTION      OB History    Gravida Para Term Preterm AB Living   4 4 1 3   5    SAB TAB Ectopic Multiple Live Births         1 5       Home Medications    Prior to Admission medications   Medication Sig Start Date End Date Taking? Authorizing Provider  etonogestrel (NEXPLANON) 68 MG IMPL implant 1 each by Subdermal route once.   Yes [provider]  amoxicillin-clavulanate (AUGMENTIN) 875-125 MG tablet Take 1 tablet by mouth 2 (two) times daily. 04/08/17   Hassan Rowan, MD  budesonide (RHINOCORT AQUA) 32 MCG/ACT nasal spray Place into the nose.    [provider]  busPIRone (BUSPAR) 10 MG tablet Take 1 tablet (10 mg total) by mouth 2 (two) times daily. 04/01/17   Vena Austria, MD  cholecalciferol (VITAMIN D) 1000 units tablet Take 1,000 Units by mouth daily.    [provider]  docusate sodium (COLACE) 100 MG capsule Take 100 mg by mouth daily.    [provider]  ferrous sulfate 325 (65 FE) MG EC tablet Take 325 mg by mouth daily with breakfast.    [provider]  fexofenadine-pseudoephedrine (ALLEGRA-D ALLERGY & CONGESTION) 180-240 MG 24 hr tablet Take 1 tablet by mouth daily. 04/08/17  Hassan Rowan, MD  fluticasone [facility]) 50 MCG/ACT nasal spray Place 2 sprays into both nostrils daily. 04/08/17   Hassan Rowan, MD  ibuprofen (ADVIL,MOTRIN) 600 MG tablet Take 1 tablet (600 mg total) by mouth every 6 (six) hours. Patient not taking: Reported on 03/03/2017 02/19/17   Vena Austria, MD  Prenatal Vit-Fe Fumarate-FA (PRENATAL MULTIVITAMIN) TABS tablet Take 1 tablet by mouth daily at 12 noon.    [provider]  sertraline (ZOLOFT) 100 MG tablet Take 1 tablet (100 mg total) by mouth daily. 04/01/17   Vena Austria, MD    Family History Family History  Problem Relation Age of Onset  . Asthma Mother   . Drug  abuse Mother   . Early death Mother   . Diabetes Maternal Aunt   . Hypertension Maternal Aunt   . Drug abuse Maternal Aunt   . Drug abuse Maternal Uncle   . Hypertension Maternal Grandmother   . Heart disease Maternal Grandmother   . Hyperlipidemia Maternal Grandmother   . Stroke Maternal Grandmother     Social History Social History  Substance Use Topics  . Smoking status: Current Every Day Smoker    Packs/day: 0.25    Types: Cigarettes  . Smokeless tobacco: Never Used  . Alcohol use Yes     Comment: social     Allergies   Magnesium sulfate and Sulfa antibiotics   Review of Systems Review of Systems  Constitutional: Negative for activity change and appetite change.  HENT: Positive for congestion, sinus pain and sinus pressure.   Respiratory: Positive for cough.   All other systems reviewed and are negative.    Physical Exam Triage Vital Signs ED Triage Vitals  Enc Vitals Group     BP 04/08/17 1027 131/85     Pulse Rate 04/08/17 1027 60     Resp 04/08/17 1027 16     Temp 04/08/17 1027 98.1 F (36.7 C)     Temp Source 04/08/17 1027 Oral     SpO2 04/08/17 1027 100 %     Weight 04/08/17 1028 145 lb (65.8 kg)     Height 04/08/17 1028 5' (1.524 m)     Head Circumference --      Peak Flow --      Pain Score --      Pain Loc --      Pain Edu? --      Excl. in GC? --    No data found.   Updated Vital Signs BP 131/85 (BP Location: Right Arm)   Pulse 60   Temp 98.1 F (36.7 C) (Oral)   Resp 16   Ht 5' (1.524 m)   Wt 145 lb (65.8 kg)   SpO2 100%   BMI 28.32 kg/m   Visual Acuity Right Eye Distance:   Left Eye Distance:   Bilateral Distance:    Right Eye Near:   Left Eye Near:    Bilateral Near:     Physical Exam  Constitutional: She is oriented to person, place, and time. She appears well-developed and well-nourished. No distress.  HENT:  Head: Normocephalic and atraumatic.  Right Ear: Hearing, tympanic membrane, external ear and ear canal  normal.  Left Ear: Hearing, tympanic membrane, external ear and ear canal normal.  Nose: Right sinus exhibits no maxillary sinus tenderness and no frontal sinus tenderness. Left sinus exhibits maxillary sinus tenderness. Left sinus exhibits no frontal sinus tenderness.  Mouth/Throat: Uvula is midline. No oral lesions. No uvula swelling. No oropharyngeal exudate  or posterior oropharyngeal erythema.  Eyes: Pupils are equal, round, and reactive to light.  Neck: Normal range of motion. Neck supple.  Pulmonary/Chest: Effort normal.  Musculoskeletal: Normal range of motion.  Neurological: She is alert and oriented to person, place, and time.  Skin: Skin is warm and dry. She is not diaphoretic.  Psychiatric: She has a normal mood and affect.  Vitals reviewed.    UC Treatments / Results  Labs (all labs ordered are listed, but only abnormal results are displayed) Labs Reviewed - No data to display  EKG  EKG Interpretation None       Radiology No results found.  Procedures Procedures (including critical care time)  Medications Ordered in UC Medications - No data to display   Initial Impression / Assessment and Plan / UC Course  I have reviewed the triage vital signs and the nursing notes.  Pertinent labs & imaging results that were available during my care of the patient were reviewed by me and considered in my medical decision making (see chart for details).     Sinusitis. We'll place Augmentin 875 one tablet twice a day Allegra-D 1 tablet daily Flonase nasal spray 2 puffs each nostril daily follow-up with PCP in 2-3 weeks if not better.  Final Clinical Impressions(s) / UC Diagnoses   Final diagnoses:  Acute maxillary sinusitis, recurrence not specified    New Prescriptions New Prescriptions   AMOXICILLIN-CLAVULANATE (AUGMENTIN) 875-125 MG TABLET    Take 1 tablet by mouth 2 (two) times daily.   FEXOFENADINE-PSEUDOEPHEDRINE (ALLEGRA-D ALLERGY & CONGESTION) 180-240 MG 24  HR TABLET    Take 1 tablet by mouth daily.   FLUTICASONE (FLONASE) 50 MCG/ACT NASAL SPRAY    Place 2 sprays into both nostrils daily.    Note: This dictation was prepared with Dragon dictation along with smaller phrase technology. Any transcriptional errors that result from this process are unintentional. Controlled Substance Prescriptions [place] Controlled Substance Registry consulted? Not Applicable   Hassan Rowan, MD 04/08/17 1147

## 2017-04-08 NOTE — ED Triage Notes (Signed)
Pt thinks she has sinus issues. Recurrent. Has drainage down back of throat and sometimes feel stuck. No fever. Also wants her upper right arm examined to determine if she was bit by a spider.

## 2017-05-12 ENCOUNTER — Ambulatory Visit: Payer: Medicaid Other | Admitting: Obstetrics and Gynecology

## 2017-05-12 ENCOUNTER — Other Ambulatory Visit: Payer: Self-pay | Admitting: Obstetrics and Gynecology

## 2017-05-12 ENCOUNTER — Ambulatory Visit
Admission: RE | Admit: 2017-05-12 | Discharge: 2017-05-12 | Disposition: A | Discharge status: Home / Self Care | Payer: Disability Insurance | Source: Ambulatory Visit | Attending: Obstetrics and Gynecology | Admitting: Obstetrics and Gynecology

## 2017-05-12 DIAGNOSIS — M25551 Pain in right hip: Secondary | ICD-10-CM

## 2017-05-12 DIAGNOSIS — M25552 Pain in left hip: Secondary | ICD-10-CM

## 2017-05-12 DIAGNOSIS — M069 Rheumatoid arthritis, unspecified: Secondary | ICD-10-CM

## 2017-05-12 DIAGNOSIS — M48061 Spinal stenosis, lumbar region without neurogenic claudication: Secondary | ICD-10-CM | POA: Insufficient documentation

## 2017-05-12 DIAGNOSIS — M5136 Other intervertebral disc degeneration, lumbar region: Secondary | ICD-10-CM

## 2017-05-12 DIAGNOSIS — M25559 Pain in unspecified hip: Secondary | ICD-10-CM | POA: Diagnosis present

## 2017-05-12 DIAGNOSIS — M5430 Sciatica, unspecified side: Secondary | ICD-10-CM | POA: Insufficient documentation

## 2017-05-19 ENCOUNTER — Ambulatory Visit: Payer: Medicaid Other | Admitting: Obstetrics and Gynecology

## 2018-01-20 IMAGING — US US MFM OB FOLLOW-UP EACH ADDL GEST (MODIFY)
1 series · 14 of 28 positions shown · non-contrast
Comparison: none

[Series 1: us mfm ob follow-up each addl gest (modify) · 0.25mm/px · 14 of 64 slices shown]
[im 3/64]
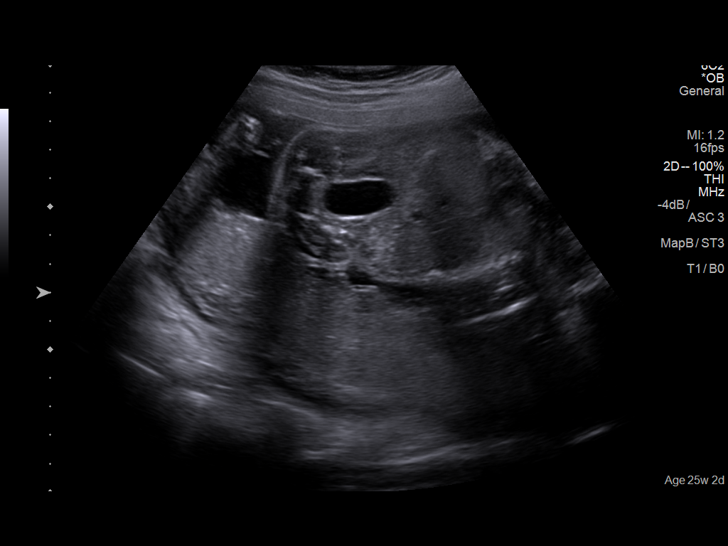
[im 8/64]
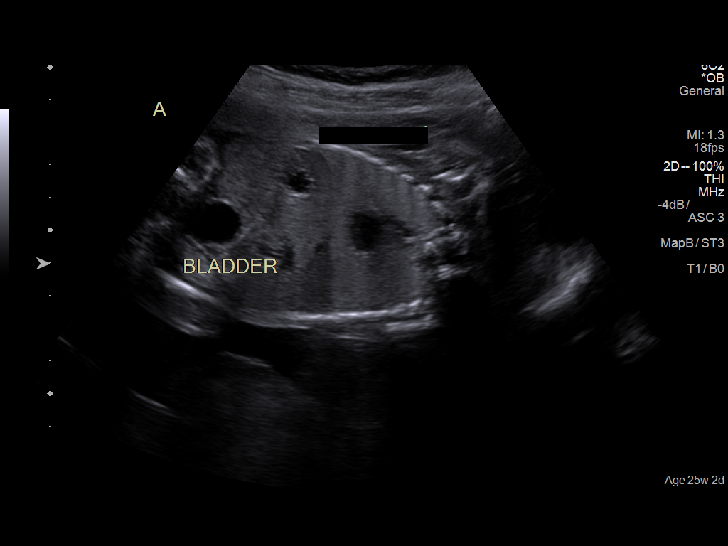
[im 12/64]
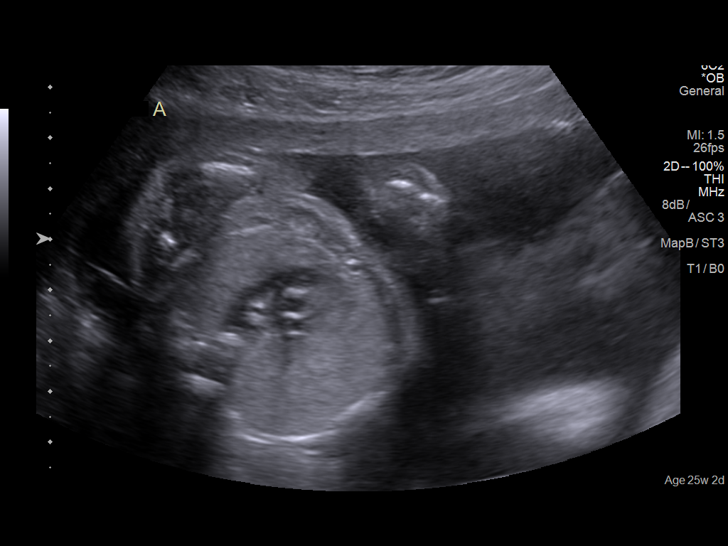
[im 17/64]
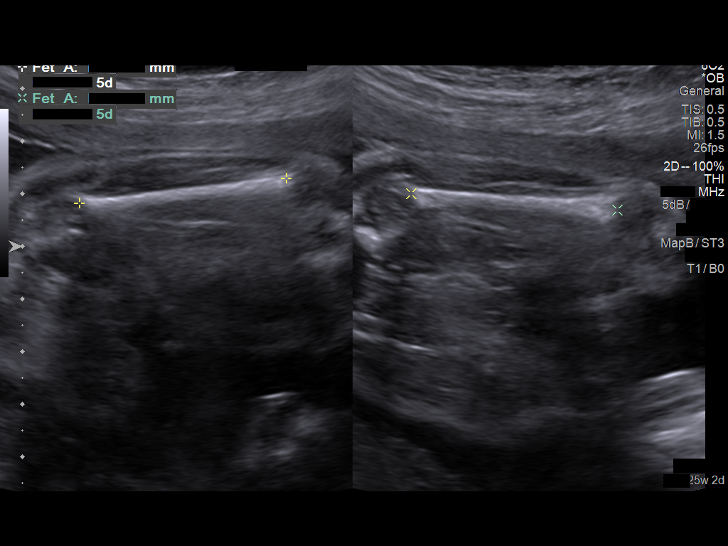
[im 22/64]
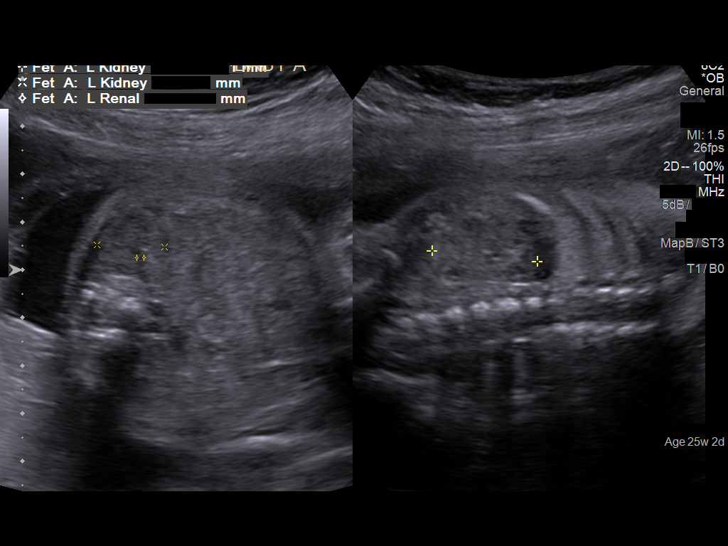
[im 26/64]
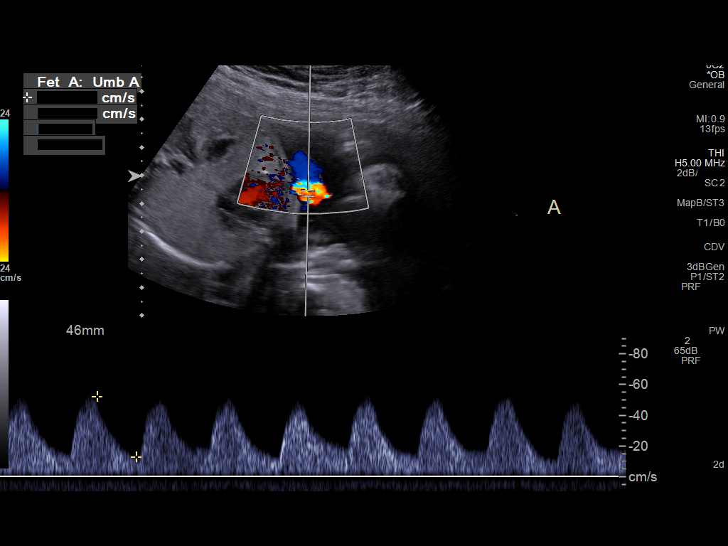
[im 31/64]
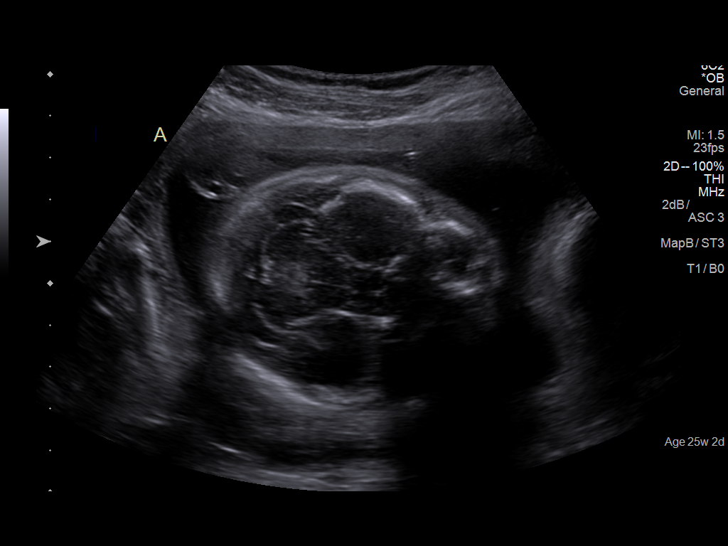
[im 36/64]
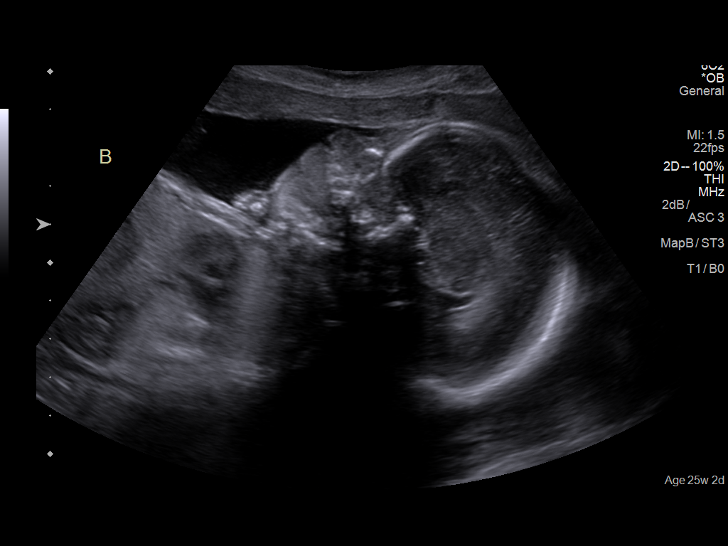
[im 40/64]
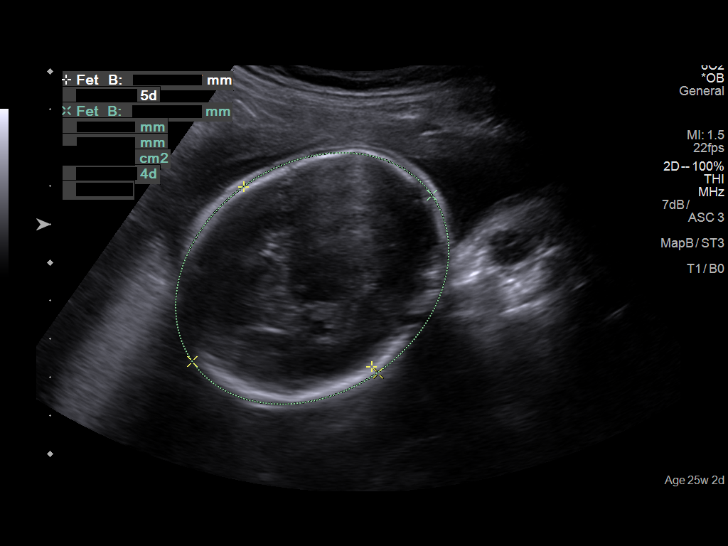
[im 45/64]
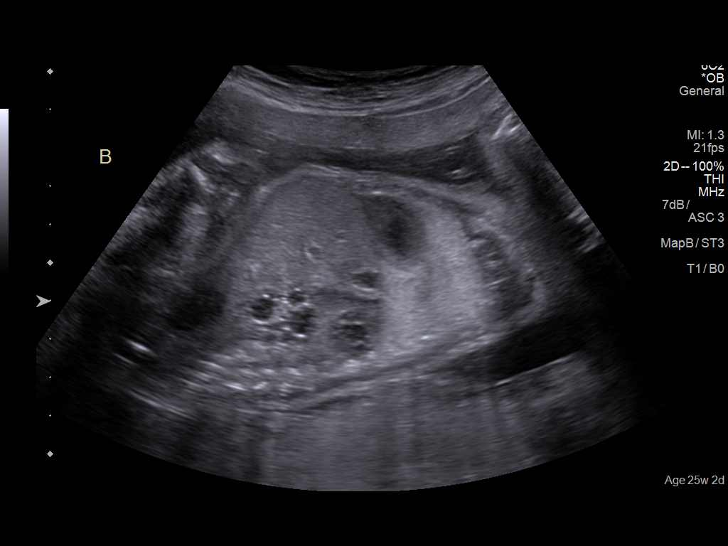
[im 50/64]
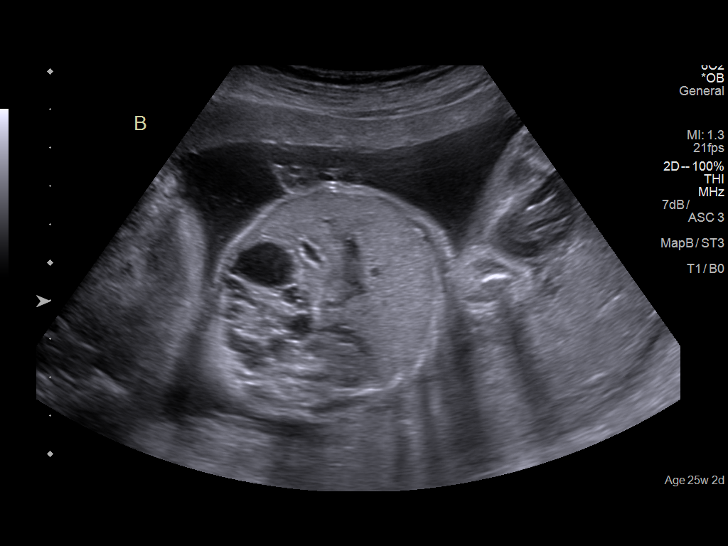
[im 54/64]
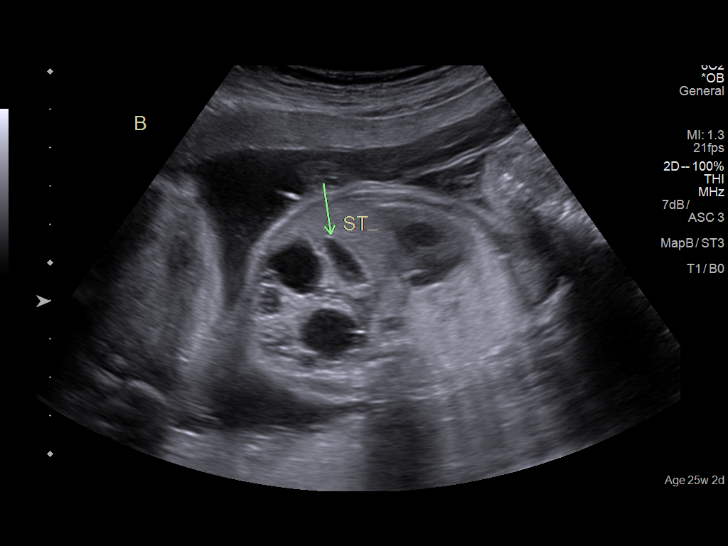
[im 59/64]
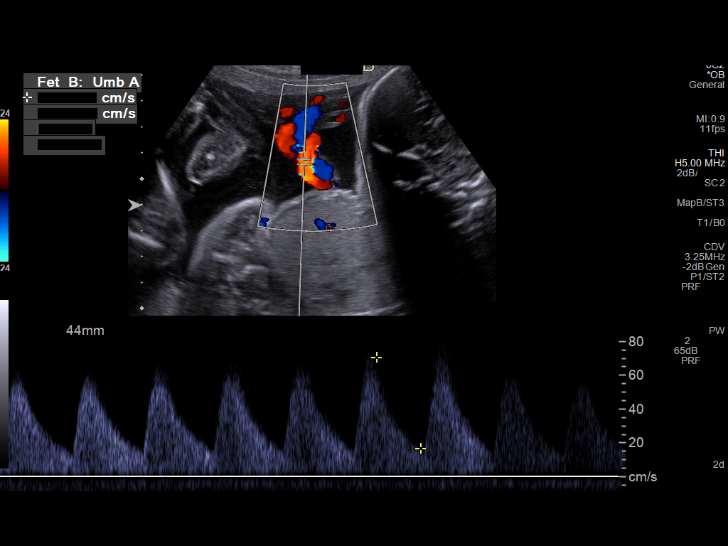
[im 64/64]
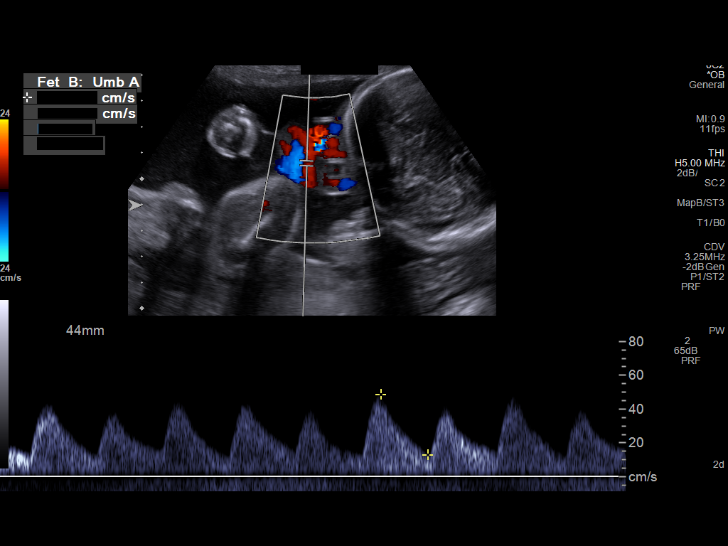

[14 of 28 positions shown; findings below may reference images not displayed]

Canned report from images found in remote index.

Refer to host system for actual result text.

## 2018-01-30 IMAGING — US US MFM FETAL BPP W/O NON-STRESS
1 series · 13 of 28 positions shown · non-contrast
Comparison: none

PATIENT INFO:

PERFORMED BY:
SERVICE(S) PROVIDED:
US MFM UA CORD DOPPLER                                76820.02
US MFM OB LIMITED                                     76815.01
INDICATIONS:
26 weeks gestation of pregnancy
FETAL EVALUATION (FETUS A):
Num Of Fetuses:     2
Fetal Heart         145
Rate(bpm):
Presentation:       Cephalic
Placenta:           Posterior Grade 1, No previa
BIOPHYSICAL EVALUATION (FETUS A):
Amniotic F.V:   Within normal limits       F. Tone:         Observed
F. Movement:    Observed                   Score:           [DATE]
F. Breathing:   Not Observed
GESTATIONAL AGE (FETUS A):
LMP:           26w 6d        Date:  07/05/16                 EDD:   04/11/17
Best:          26w 5d     Det. By:  Early Ultrasound         EDD:   04/12/17
(10/07/16)
DOPPLER - FETAL VESSELS (FETUS A):
Umbilical Artery
S/D     %tile     RI    %tile                     PSV
(cm/s)
2.56       20   0.[REDACTED]
FETAL EVALUATION (FETUS B):
Fetal Heart         153
Presentation:       Transverse, head to maternal left
Largest Pocket(cm)
2.12
BIOPHYSICAL EVALUATION (FETUS B):
GESTATIONAL AGE (FETUS B):
DOPPLER - FETAL VESSELS (FETUS B):
2.76       30   0.[REDACTED]
CERVIX UTERUS ADNEXA:
Cervix
Length:           2.19  cm.

[Series 1: us mfm fetal bpp w/o non-stress · 0.23mm/px · 13 of 36 slices shown]
[im 2/36]
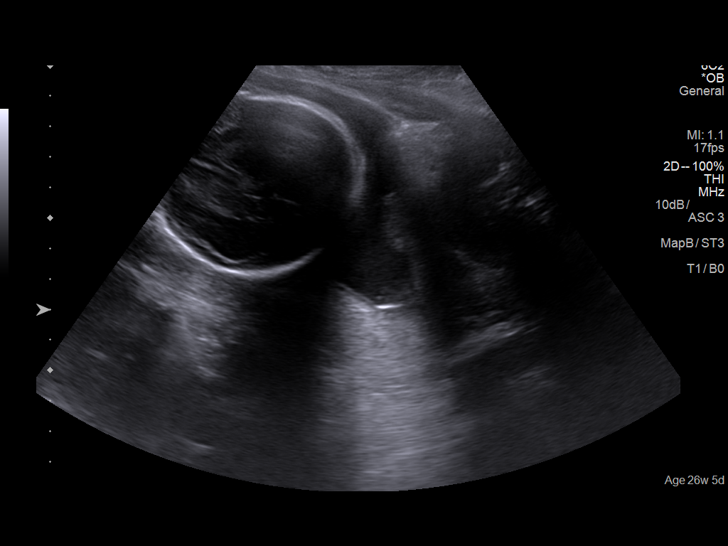
[im 4/36]
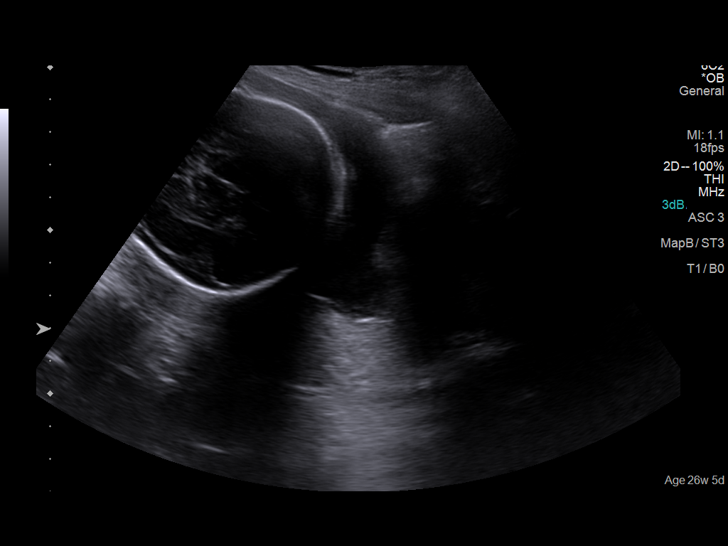
[im 7/36]
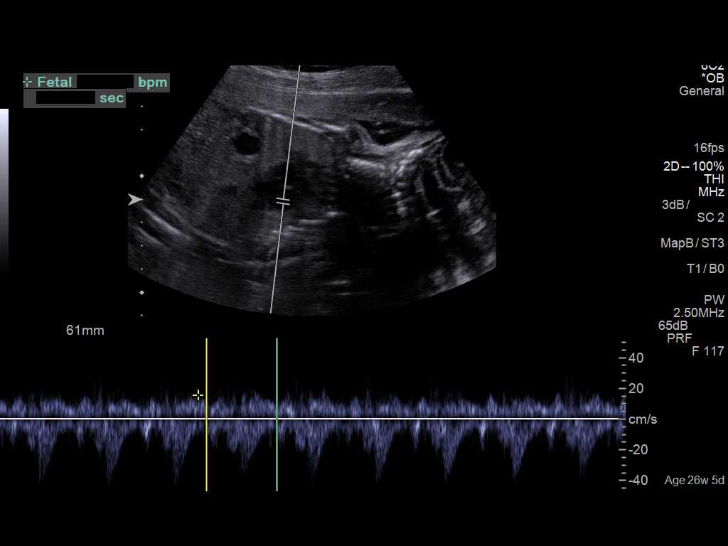
[im 10/36]
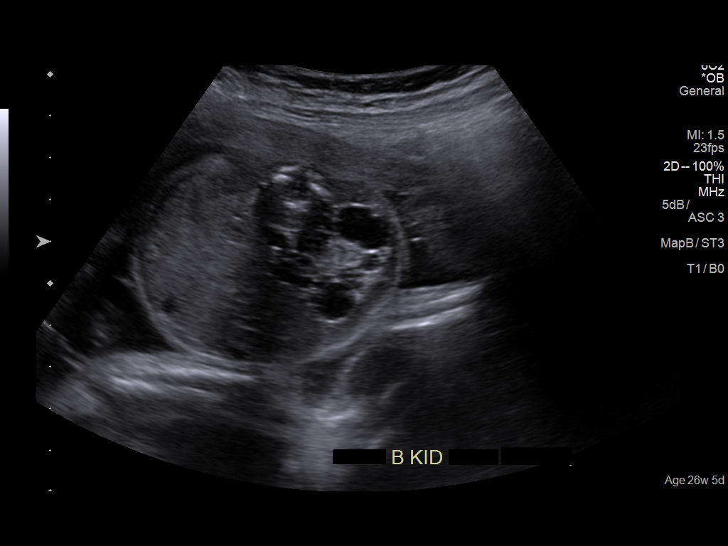
[im 12/36]
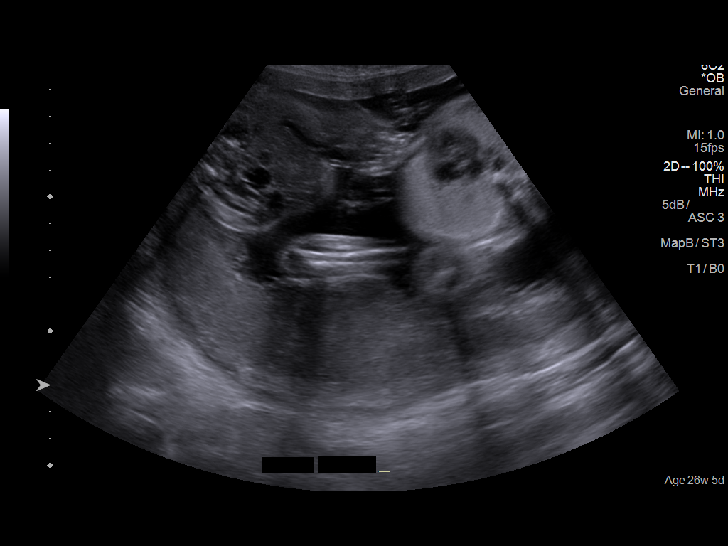
[im 15/36]
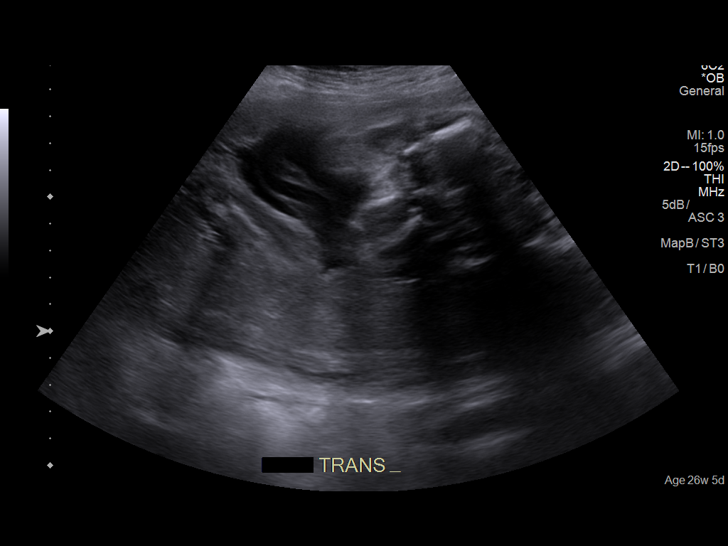
[im 19/36]
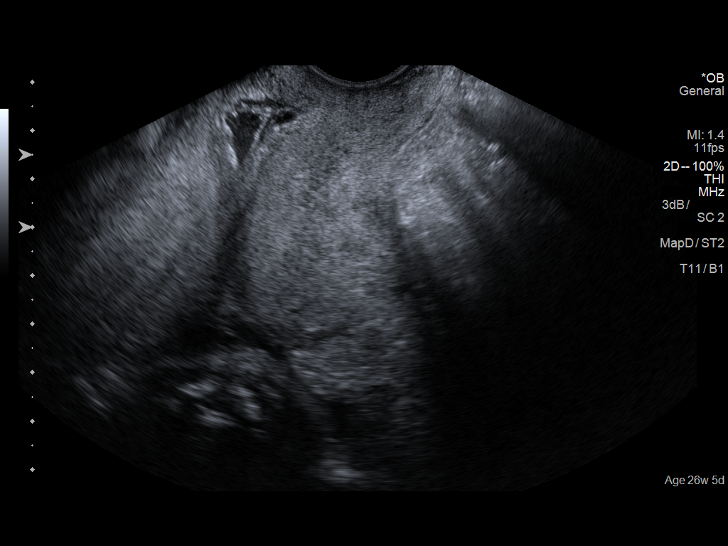
[im 21/36]
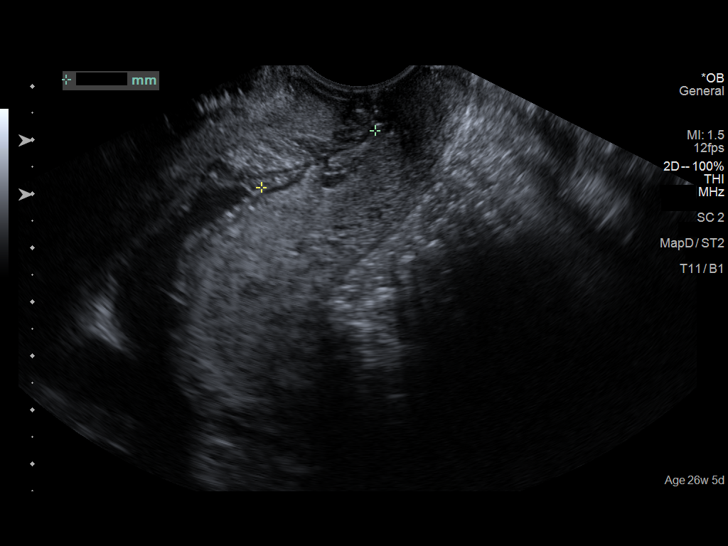
[im 24/36]
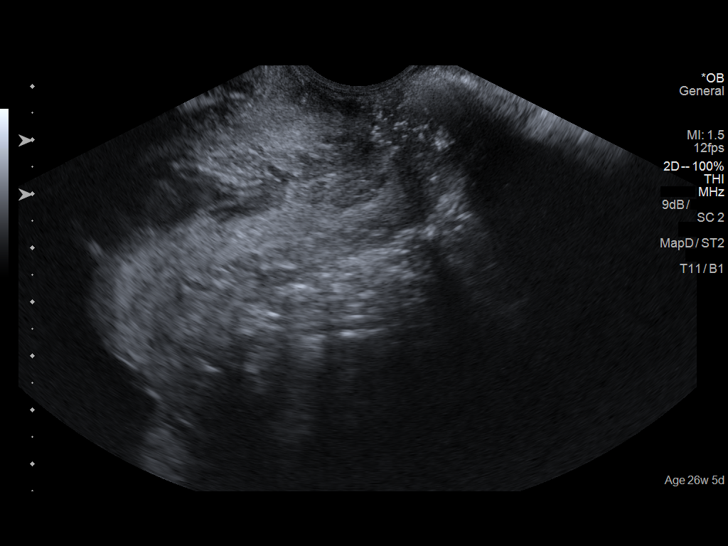
[im 26/36]
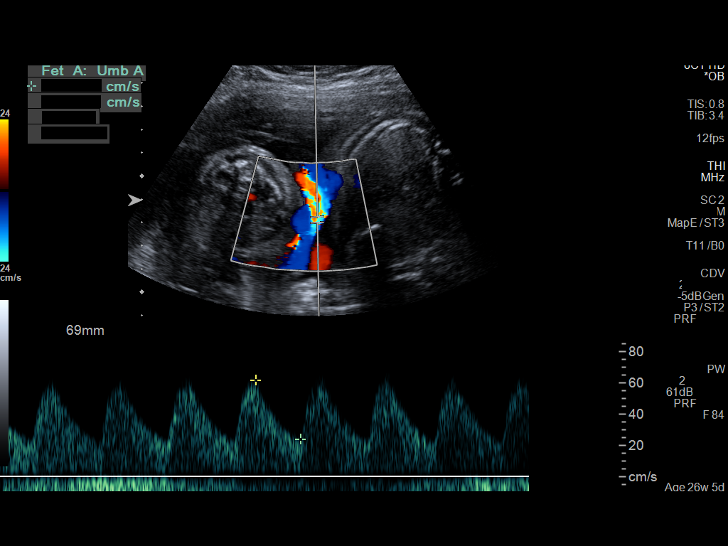
[im 29/36]
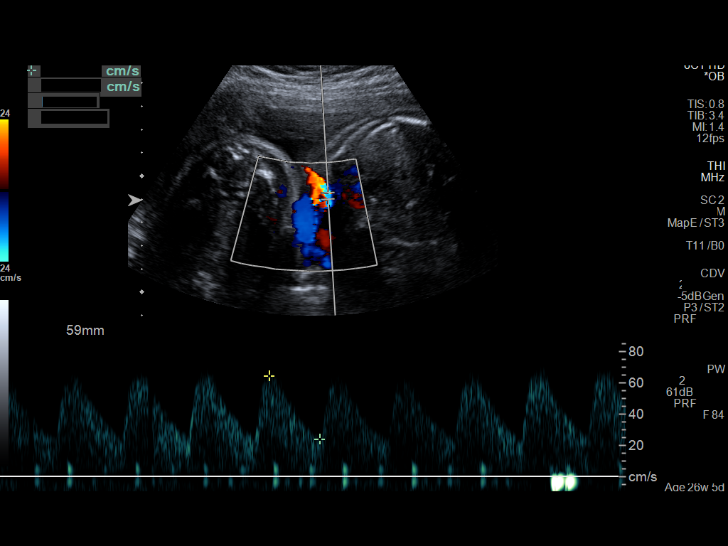
[im 32/36]
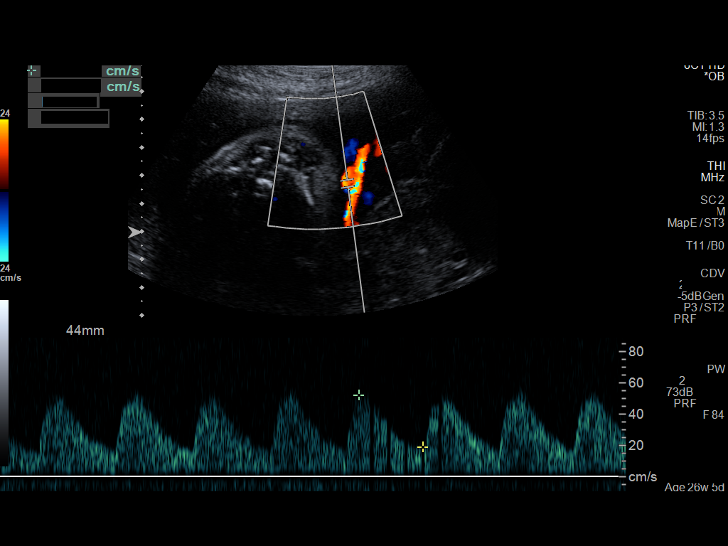
[im 34/36]
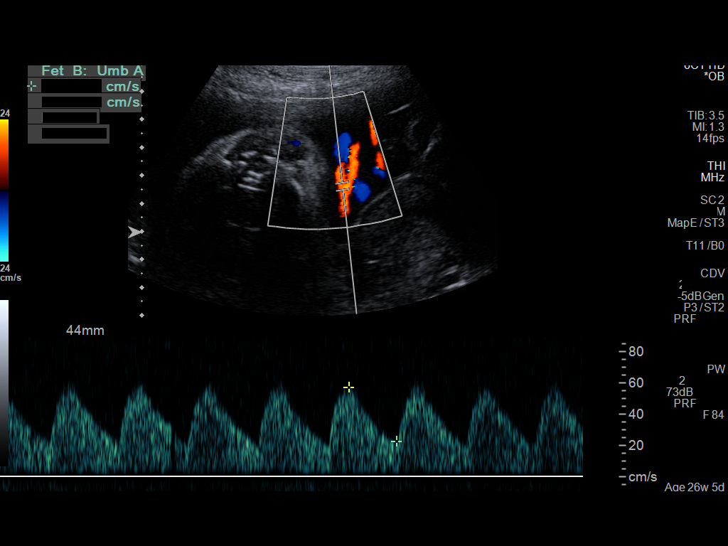

[13 of 28 positions shown; findings below may reference images not displayed]

IMPRESSION: Dear Dr. MARITZA,

Thank you for referring your patient for a twin biophysical
profile study (BPP).

Janiya Boucher/Bradford Sablan pregnancy is identified at 26 [DATE] weeks
gestation dating is by a a 13 w 2d u/s done at [facility] [[facility]] on
10/07/16
They were noted to both have EFW <3rd percentile at her
last scan on 12/30/16
A thin dividing membrane is once again identified.

Twin A is located on the maternal   right   side inferior
(posterior placenta).
Twin B is located on the maternal   left superior    side
(posterior placenta).

Twin A is cephalic.
Twin B is transverse

The amniotic fluid is within normal limits in each sac.
The MVP for Twin A is   2.2 cm.
The MVP for Twin B is   2.1 cm.

Twin B has multicystic dysplastic kidney on the left .

Dopplers midcord s/d ratio
A-
W-W.MW

f/u fluid and dopplers ordered in one week
Thank you for allowing us to participate in your patient's care.
cervix was noted to be funnelled on TA scanning
EV it measured 2.2-2.6 cm
CAIMAPO - closed/mid/50%/ soft and high -5
fFN sent and pt ws placed on Toco

## 2018-06-11 IMAGING — CR DG LUMBAR SPINE 2-3V
1 series · 2 of 2 positions shown · non-contrast
Comparison: None.

CLINICAL DATA: Subacute onset of lower back pain, radiating down
both hips. Initial encounter.

EXAM:
LUMBAR SPINE - 2-3 VIEW

[Series 1: dg lumbar spine 2-3 views · 0.14mm/px · 2 of 2 slices shown]
[im 1/2]
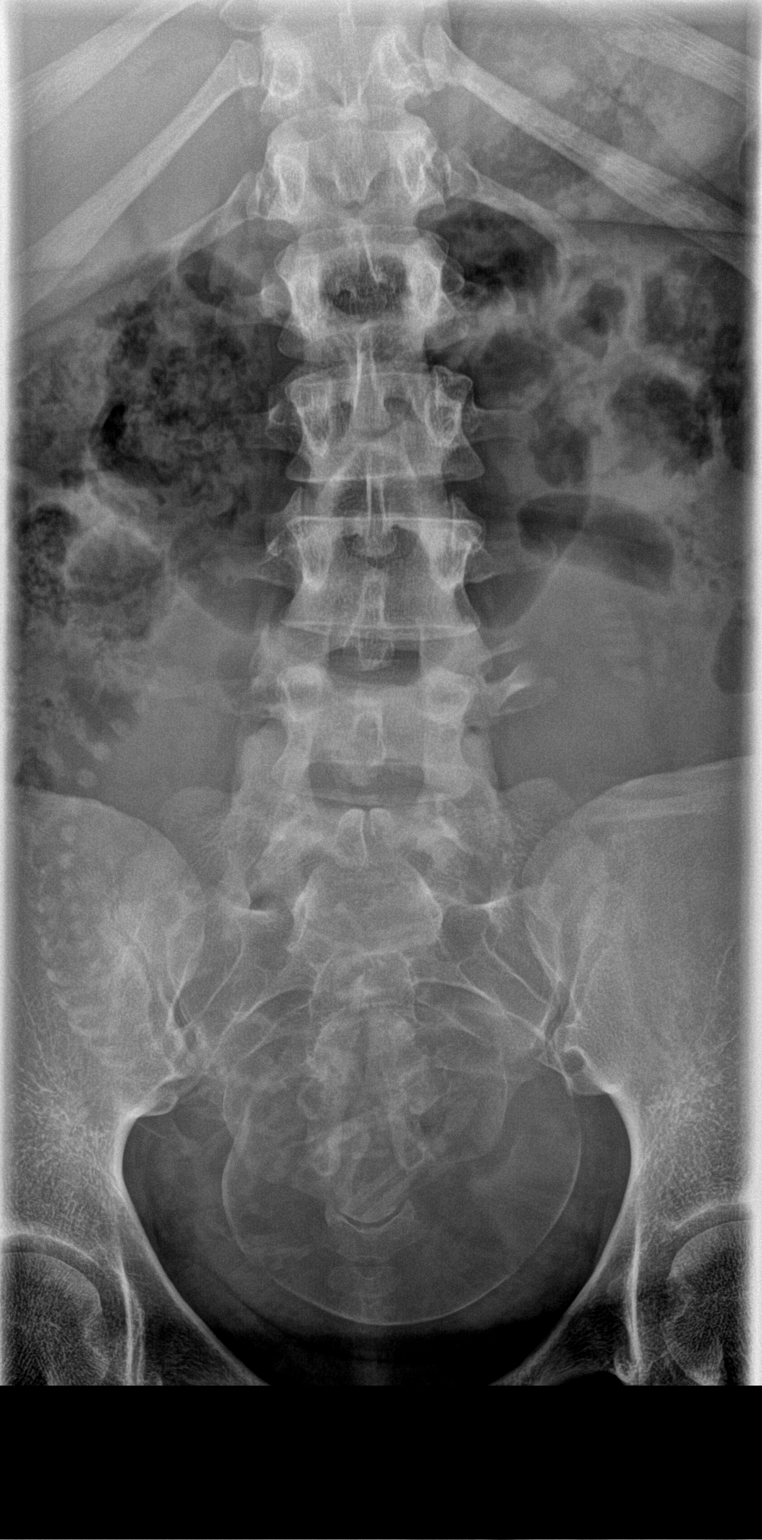
[im 2/2]
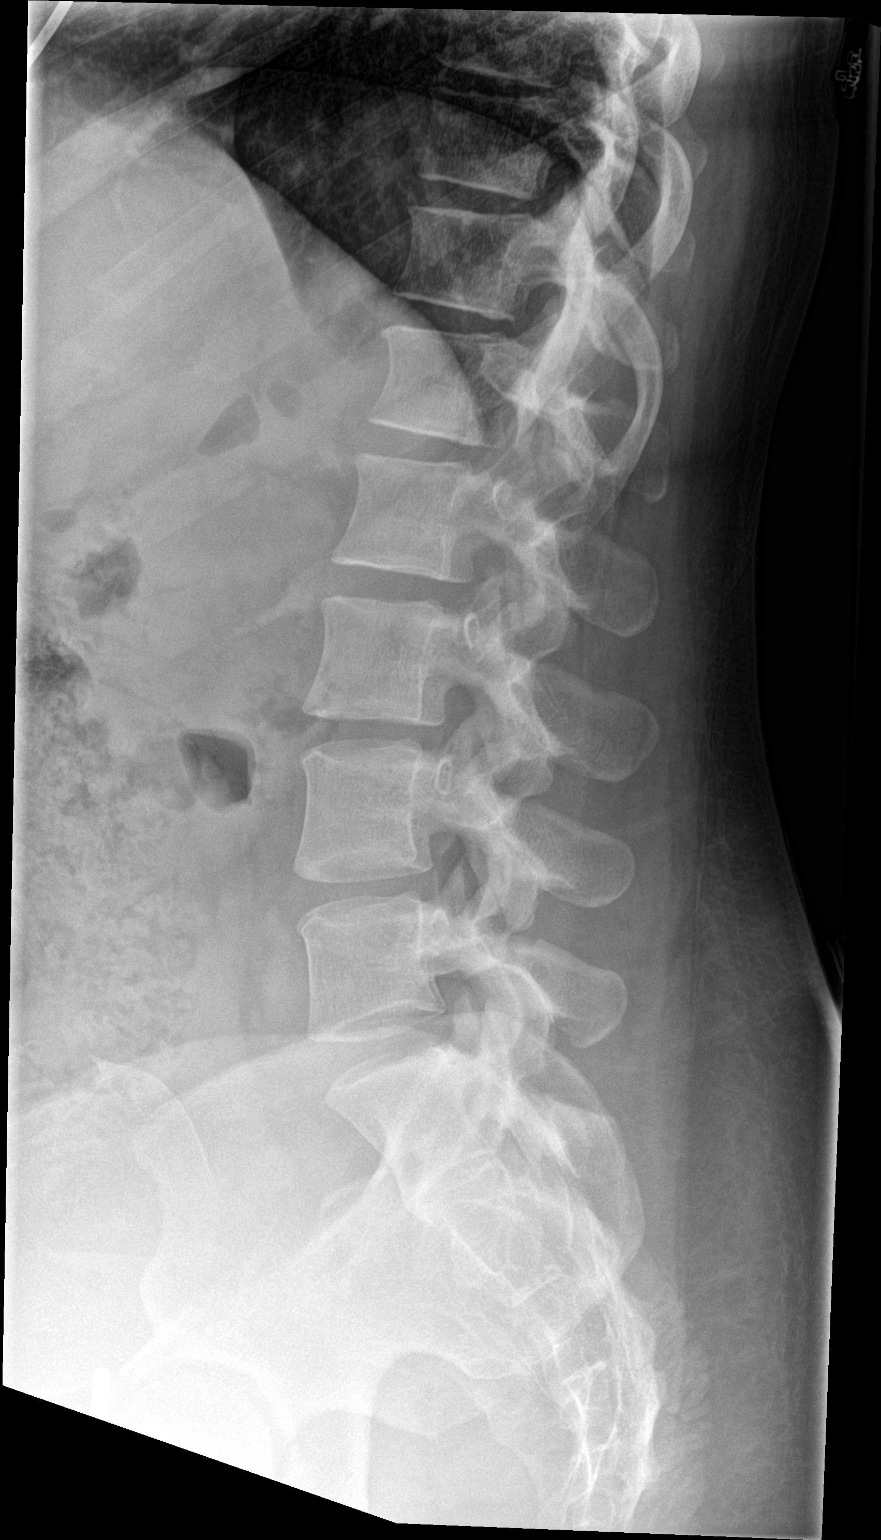

[2 of 2 positions shown; findings below may reference images not displayed]

FINDINGS: There is no evidence of fracture or subluxation. Vertebral bodies
demonstrate normal height and alignment. Intervertebral disc spaces
are preserved. The visualized neural foramina are grossly
unremarkable in appearance.

The visualized bowel gas pattern is unremarkable in appearance; air
and stool are noted within the colon. The sacroiliac joints are
within normal limits. The fetus is partially characterized.
IMPRESSION: No evidence of fracture or subluxation along the lumbar spine.
